# Patient Record
Sex: Female | Born: 1945 | Race: White | Hispanic: No | Marital: Married | Smoking: Never smoker
Health system: Southern US, Community
[De-identification: ages and names within clinical notes are randomized; demographics above are authoritative.]

## PROBLEM LIST (undated history)

## (undated) DIAGNOSIS — J45909 Unspecified asthma, uncomplicated: Secondary | ICD-10-CM

## (undated) DIAGNOSIS — E7800 Pure hypercholesterolemia, unspecified: Secondary | ICD-10-CM

## (undated) HISTORY — PX: BIOPSY CONJUNCTIVA: PRO10

## (undated) HISTORY — PX: BREAST EXCISIONAL BIOPSY: SUR124

---

## 2000-09-18 ENCOUNTER — Emergency Department (HOSPITAL_COMMUNITY): Admission: EM | Admit: 2000-09-18 | Discharge: 2000-09-18 | Payer: Self-pay | Admitting: Emergency Medicine

## 2001-05-11 ENCOUNTER — Other Ambulatory Visit: Admission: RE | Admit: 2001-05-11 | Discharge: 2001-05-11 | Payer: Self-pay | Admitting: Family Medicine

## 2001-12-24 ENCOUNTER — Emergency Department (HOSPITAL_COMMUNITY): Admission: EM | Admit: 2001-12-24 | Discharge: 2001-12-24 | Payer: Self-pay | Admitting: Emergency Medicine

## 2005-08-24 ENCOUNTER — Other Ambulatory Visit: Admission: RE | Admit: 2005-08-24 | Discharge: 2005-08-24 | Payer: Self-pay | Admitting: Obstetrics & Gynecology

## 2005-09-13 ENCOUNTER — Encounter: Admission: RE | Admit: 2005-09-13 | Discharge: 2005-09-13 | Payer: Self-pay | Admitting: Obstetrics & Gynecology

## 2005-09-21 ENCOUNTER — Encounter: Admission: RE | Admit: 2005-09-21 | Discharge: 2005-09-21 | Payer: Self-pay | Admitting: Obstetrics & Gynecology

## 2006-11-17 ENCOUNTER — Other Ambulatory Visit: Admission: RE | Admit: 2006-11-17 | Discharge: 2006-11-17 | Payer: Self-pay | Admitting: Family Medicine

## 2008-02-29 ENCOUNTER — Other Ambulatory Visit: Admission: RE | Admit: 2008-02-29 | Discharge: 2008-02-29 | Payer: Self-pay | Admitting: Internal Medicine

## 2008-03-04 ENCOUNTER — Encounter: Admission: RE | Admit: 2008-03-04 | Discharge: 2008-03-04 | Payer: Self-pay | Admitting: Internal Medicine

## 2008-03-11 ENCOUNTER — Encounter: Admission: RE | Admit: 2008-03-11 | Discharge: 2008-03-11 | Payer: Self-pay | Admitting: Internal Medicine

## 2009-04-18 ENCOUNTER — Encounter: Admission: RE | Admit: 2009-04-18 | Discharge: 2009-04-18 | Payer: Self-pay | Admitting: Internal Medicine

## 2010-04-03 ENCOUNTER — Other Ambulatory Visit: Payer: Self-pay | Admitting: Internal Medicine

## 2010-04-03 DIAGNOSIS — Z1231 Encounter for screening mammogram for malignant neoplasm of breast: Secondary | ICD-10-CM

## 2010-05-05 ENCOUNTER — Ambulatory Visit
Admission: RE | Admit: 2010-05-05 | Discharge: 2010-05-05 | Disposition: A | Discharge status: Home / Self Care | Payer: BLUE CROSS/BLUE SHIELD | Source: Ambulatory Visit | Attending: Internal Medicine | Admitting: Internal Medicine

## 2010-05-05 DIAGNOSIS — Z1231 Encounter for screening mammogram for malignant neoplasm of breast: Secondary | ICD-10-CM

## 2011-04-01 ENCOUNTER — Other Ambulatory Visit: Payer: Self-pay | Admitting: Internal Medicine

## 2011-04-01 DIAGNOSIS — Z1231 Encounter for screening mammogram for malignant neoplasm of breast: Secondary | ICD-10-CM

## 2011-05-06 ENCOUNTER — Ambulatory Visit
Admission: RE | Admit: 2011-05-06 | Discharge: 2011-05-06 | Disposition: A | Discharge status: Home / Self Care | Payer: BC Managed Care – PPO | Source: Ambulatory Visit | Attending: Internal Medicine | Admitting: Internal Medicine

## 2011-05-06 DIAGNOSIS — Z1231 Encounter for screening mammogram for malignant neoplasm of breast: Secondary | ICD-10-CM

## 2012-05-29 ENCOUNTER — Other Ambulatory Visit: Payer: Self-pay

## 2012-05-29 DIAGNOSIS — Z1231 Encounter for screening mammogram for malignant neoplasm of breast: Secondary | ICD-10-CM

## 2012-06-21 ENCOUNTER — Ambulatory Visit
Admission: RE | Admit: 2012-06-21 | Discharge: 2012-06-21 | Disposition: A | Discharge status: Home / Self Care | Payer: BC Managed Care – PPO | Source: Ambulatory Visit

## 2012-06-21 DIAGNOSIS — Z1231 Encounter for screening mammogram for malignant neoplasm of breast: Secondary | ICD-10-CM

## 2012-12-22 DIAGNOSIS — H1044 Vernal conjunctivitis: Secondary | ICD-10-CM | POA: Diagnosis not present

## 2013-04-10 DIAGNOSIS — M899 Disorder of bone, unspecified: Secondary | ICD-10-CM | POA: Diagnosis not present

## 2013-04-10 DIAGNOSIS — E785 Hyperlipidemia, unspecified: Secondary | ICD-10-CM | POA: Diagnosis not present

## 2013-04-17 DIAGNOSIS — M899 Disorder of bone, unspecified: Secondary | ICD-10-CM | POA: Diagnosis not present

## 2013-04-17 DIAGNOSIS — E785 Hyperlipidemia, unspecified: Secondary | ICD-10-CM | POA: Diagnosis not present

## 2013-04-17 DIAGNOSIS — F411 Generalized anxiety disorder: Secondary | ICD-10-CM | POA: Diagnosis not present

## 2013-04-17 DIAGNOSIS — Z23 Encounter for immunization: Secondary | ICD-10-CM | POA: Diagnosis not present

## 2013-04-17 DIAGNOSIS — M1710 Unilateral primary osteoarthritis, unspecified knee: Secondary | ICD-10-CM | POA: Diagnosis not present

## 2013-04-17 DIAGNOSIS — M949 Disorder of cartilage, unspecified: Secondary | ICD-10-CM | POA: Diagnosis not present

## 2013-04-17 DIAGNOSIS — IMO0002 Reserved for concepts with insufficient information to code with codable children: Secondary | ICD-10-CM | POA: Diagnosis not present

## 2013-06-14 ENCOUNTER — Other Ambulatory Visit: Payer: Self-pay

## 2013-06-14 DIAGNOSIS — Z1231 Encounter for screening mammogram for malignant neoplasm of breast: Secondary | ICD-10-CM

## 2013-07-02 ENCOUNTER — Ambulatory Visit
Admission: RE | Admit: 2013-07-02 | Discharge: 2013-07-02 | Disposition: A | Discharge status: Home / Self Care | Payer: Medicare Other | Source: Ambulatory Visit

## 2013-07-02 DIAGNOSIS — Z1231 Encounter for screening mammogram for malignant neoplasm of breast: Secondary | ICD-10-CM | POA: Diagnosis not present

## 2013-10-10 DIAGNOSIS — E785 Hyperlipidemia, unspecified: Secondary | ICD-10-CM | POA: Diagnosis not present

## 2013-10-10 DIAGNOSIS — M949 Disorder of cartilage, unspecified: Secondary | ICD-10-CM | POA: Diagnosis not present

## 2013-10-10 DIAGNOSIS — R609 Edema, unspecified: Secondary | ICD-10-CM | POA: Diagnosis not present

## 2013-10-10 DIAGNOSIS — M899 Disorder of bone, unspecified: Secondary | ICD-10-CM | POA: Diagnosis not present

## 2013-10-17 DIAGNOSIS — Z23 Encounter for immunization: Secondary | ICD-10-CM | POA: Diagnosis not present

## 2013-10-17 DIAGNOSIS — M899 Disorder of bone, unspecified: Secondary | ICD-10-CM | POA: Diagnosis not present

## 2013-10-17 DIAGNOSIS — M949 Disorder of cartilage, unspecified: Secondary | ICD-10-CM | POA: Diagnosis not present

## 2013-10-17 DIAGNOSIS — F411 Generalized anxiety disorder: Secondary | ICD-10-CM | POA: Diagnosis not present

## 2013-10-17 DIAGNOSIS — E785 Hyperlipidemia, unspecified: Secondary | ICD-10-CM | POA: Diagnosis not present

## 2013-10-17 DIAGNOSIS — M1710 Unilateral primary osteoarthritis, unspecified knee: Secondary | ICD-10-CM | POA: Diagnosis not present

## 2013-10-17 DIAGNOSIS — IMO0002 Reserved for concepts with insufficient information to code with codable children: Secondary | ICD-10-CM | POA: Diagnosis not present

## 2013-12-18 DIAGNOSIS — H26493 Other secondary cataract, bilateral: Secondary | ICD-10-CM | POA: Diagnosis not present

## 2014-04-16 DIAGNOSIS — Z0000 Encounter for general adult medical examination without abnormal findings: Secondary | ICD-10-CM | POA: Diagnosis not present

## 2014-04-16 DIAGNOSIS — Z23 Encounter for immunization: Secondary | ICD-10-CM | POA: Diagnosis not present

## 2014-04-16 DIAGNOSIS — M859 Disorder of bone density and structure, unspecified: Secondary | ICD-10-CM | POA: Diagnosis not present

## 2014-04-16 DIAGNOSIS — E785 Hyperlipidemia, unspecified: Secondary | ICD-10-CM | POA: Diagnosis not present

## 2014-04-16 DIAGNOSIS — E663 Overweight: Secondary | ICD-10-CM | POA: Diagnosis not present

## 2014-04-24 DIAGNOSIS — M8580 Other specified disorders of bone density and structure, unspecified site: Secondary | ICD-10-CM | POA: Diagnosis not present

## 2014-04-24 DIAGNOSIS — F419 Anxiety disorder, unspecified: Secondary | ICD-10-CM | POA: Diagnosis not present

## 2014-04-24 DIAGNOSIS — E2839 Other primary ovarian failure: Secondary | ICD-10-CM | POA: Diagnosis not present

## 2014-04-24 DIAGNOSIS — E785 Hyperlipidemia, unspecified: Secondary | ICD-10-CM | POA: Diagnosis not present

## 2014-04-24 DIAGNOSIS — I5032 Chronic diastolic (congestive) heart failure: Secondary | ICD-10-CM | POA: Diagnosis not present

## 2014-05-19 DIAGNOSIS — Z1212 Encounter for screening for malignant neoplasm of rectum: Secondary | ICD-10-CM | POA: Diagnosis not present

## 2014-05-19 DIAGNOSIS — Z1211 Encounter for screening for malignant neoplasm of colon: Secondary | ICD-10-CM | POA: Diagnosis not present

## 2014-07-11 ENCOUNTER — Other Ambulatory Visit: Payer: Self-pay

## 2014-07-11 DIAGNOSIS — Z1231 Encounter for screening mammogram for malignant neoplasm of breast: Secondary | ICD-10-CM

## 2014-07-16 ENCOUNTER — Ambulatory Visit
Admission: RE | Admit: 2014-07-16 | Discharge: 2014-07-16 | Disposition: A | Discharge status: Home / Self Care | Payer: Medicare Other | Source: Ambulatory Visit

## 2014-07-16 DIAGNOSIS — Z1231 Encounter for screening mammogram for malignant neoplasm of breast: Secondary | ICD-10-CM | POA: Diagnosis not present

## 2014-08-07 DIAGNOSIS — Z09 Encounter for follow-up examination after completed treatment for conditions other than malignant neoplasm: Secondary | ICD-10-CM | POA: Diagnosis not present

## 2014-08-07 DIAGNOSIS — D126 Benign neoplasm of colon, unspecified: Secondary | ICD-10-CM | POA: Diagnosis not present

## 2014-08-07 DIAGNOSIS — Z86010 Personal history of colonic polyps: Secondary | ICD-10-CM | POA: Diagnosis not present

## 2014-08-07 DIAGNOSIS — D122 Benign neoplasm of ascending colon: Secondary | ICD-10-CM | POA: Diagnosis not present

## 2014-08-07 DIAGNOSIS — K640 First degree hemorrhoids: Secondary | ICD-10-CM | POA: Diagnosis not present

## 2014-08-07 DIAGNOSIS — D123 Benign neoplasm of transverse colon: Secondary | ICD-10-CM | POA: Diagnosis not present

## 2014-10-16 DIAGNOSIS — I5032 Chronic diastolic (congestive) heart failure: Secondary | ICD-10-CM | POA: Diagnosis not present

## 2014-10-16 DIAGNOSIS — M8580 Other specified disorders of bone density and structure, unspecified site: Secondary | ICD-10-CM | POA: Diagnosis not present

## 2014-10-16 DIAGNOSIS — E785 Hyperlipidemia, unspecified: Secondary | ICD-10-CM | POA: Diagnosis not present

## 2014-10-16 DIAGNOSIS — E559 Vitamin D deficiency, unspecified: Secondary | ICD-10-CM | POA: Diagnosis not present

## 2014-11-06 DIAGNOSIS — Z23 Encounter for immunization: Secondary | ICD-10-CM | POA: Diagnosis not present

## 2014-12-24 DIAGNOSIS — H26493 Other secondary cataract, bilateral: Secondary | ICD-10-CM | POA: Diagnosis not present

## 2015-03-13 DIAGNOSIS — R300 Dysuria: Secondary | ICD-10-CM | POA: Diagnosis not present

## 2015-03-31 DIAGNOSIS — N390 Urinary tract infection, site not specified: Secondary | ICD-10-CM | POA: Diagnosis not present

## 2015-04-22 DIAGNOSIS — E663 Overweight: Secondary | ICD-10-CM | POA: Diagnosis not present

## 2015-04-22 DIAGNOSIS — E559 Vitamin D deficiency, unspecified: Secondary | ICD-10-CM | POA: Diagnosis not present

## 2015-04-22 DIAGNOSIS — E785 Hyperlipidemia, unspecified: Secondary | ICD-10-CM | POA: Diagnosis not present

## 2015-04-22 DIAGNOSIS — Z0000 Encounter for general adult medical examination without abnormal findings: Secondary | ICD-10-CM | POA: Diagnosis not present

## 2015-04-22 DIAGNOSIS — Z1389 Encounter for screening for other disorder: Secondary | ICD-10-CM | POA: Diagnosis not present

## 2015-04-22 DIAGNOSIS — M8580 Other specified disorders of bone density and structure, unspecified site: Secondary | ICD-10-CM | POA: Diagnosis not present

## 2015-04-29 DIAGNOSIS — M8580 Other specified disorders of bone density and structure, unspecified site: Secondary | ICD-10-CM | POA: Diagnosis not present

## 2015-04-29 DIAGNOSIS — E785 Hyperlipidemia, unspecified: Secondary | ICD-10-CM | POA: Diagnosis not present

## 2015-04-29 DIAGNOSIS — N182 Chronic kidney disease, stage 2 (mild): Secondary | ICD-10-CM | POA: Diagnosis not present

## 2015-04-29 DIAGNOSIS — F339 Major depressive disorder, recurrent, unspecified: Secondary | ICD-10-CM | POA: Diagnosis not present

## 2015-04-29 DIAGNOSIS — D709 Neutropenia, unspecified: Secondary | ICD-10-CM | POA: Diagnosis not present

## 2015-09-12 ENCOUNTER — Other Ambulatory Visit: Payer: Self-pay | Admitting: Internal Medicine

## 2015-09-12 DIAGNOSIS — Z1231 Encounter for screening mammogram for malignant neoplasm of breast: Secondary | ICD-10-CM

## 2015-09-17 ENCOUNTER — Ambulatory Visit
Admission: RE | Admit: 2015-09-17 | Discharge: 2015-09-17 | Disposition: A | Discharge status: Home / Self Care | Payer: Medicare Other | Source: Ambulatory Visit | Attending: Internal Medicine | Admitting: Internal Medicine

## 2015-09-17 DIAGNOSIS — Z1231 Encounter for screening mammogram for malignant neoplasm of breast: Secondary | ICD-10-CM | POA: Diagnosis not present

## 2015-09-18 ENCOUNTER — Other Ambulatory Visit: Payer: Self-pay

## 2015-09-19 ENCOUNTER — Other Ambulatory Visit: Payer: Self-pay | Admitting: Internal Medicine

## 2015-09-19 DIAGNOSIS — R928 Other abnormal and inconclusive findings on diagnostic imaging of breast: Secondary | ICD-10-CM

## 2015-09-26 ENCOUNTER — Ambulatory Visit
Admission: RE | Admit: 2015-09-26 | Discharge: 2015-09-26 | Disposition: A | Discharge status: Home / Self Care | Payer: Medicare Other | Source: Ambulatory Visit | Attending: Internal Medicine | Admitting: Internal Medicine

## 2015-09-26 ENCOUNTER — Other Ambulatory Visit: Payer: Self-pay | Admitting: Internal Medicine

## 2015-09-26 DIAGNOSIS — R928 Other abnormal and inconclusive findings on diagnostic imaging of breast: Secondary | ICD-10-CM

## 2015-09-26 DIAGNOSIS — N63 Unspecified lump in breast: Secondary | ICD-10-CM | POA: Diagnosis not present

## 2015-09-29 ENCOUNTER — Ambulatory Visit
Admission: RE | Admit: 2015-09-29 | Discharge: 2015-09-29 | Disposition: A | Discharge status: Home / Self Care | Payer: Medicare Other | Source: Ambulatory Visit | Attending: Internal Medicine | Admitting: Internal Medicine

## 2015-09-29 DIAGNOSIS — R928 Other abnormal and inconclusive findings on diagnostic imaging of breast: Secondary | ICD-10-CM

## 2015-09-29 DIAGNOSIS — N6002 Solitary cyst of left breast: Secondary | ICD-10-CM | POA: Diagnosis not present

## 2015-11-05 DIAGNOSIS — E785 Hyperlipidemia, unspecified: Secondary | ICD-10-CM | POA: Diagnosis not present

## 2015-11-05 DIAGNOSIS — E559 Vitamin D deficiency, unspecified: Secondary | ICD-10-CM | POA: Diagnosis not present

## 2015-11-05 DIAGNOSIS — M8580 Other specified disorders of bone density and structure, unspecified site: Secondary | ICD-10-CM | POA: Diagnosis not present

## 2015-11-12 DIAGNOSIS — Z23 Encounter for immunization: Secondary | ICD-10-CM | POA: Diagnosis not present

## 2015-11-12 DIAGNOSIS — R05 Cough: Secondary | ICD-10-CM | POA: Diagnosis not present

## 2015-11-12 DIAGNOSIS — E785 Hyperlipidemia, unspecified: Secondary | ICD-10-CM | POA: Diagnosis not present

## 2015-11-12 DIAGNOSIS — Z8709 Personal history of other diseases of the respiratory system: Secondary | ICD-10-CM | POA: Diagnosis not present

## 2015-11-12 DIAGNOSIS — F419 Anxiety disorder, unspecified: Secondary | ICD-10-CM | POA: Diagnosis not present

## 2015-11-19 DIAGNOSIS — J45909 Unspecified asthma, uncomplicated: Secondary | ICD-10-CM | POA: Diagnosis not present

## 2015-12-10 DIAGNOSIS — J189 Pneumonia, unspecified organism: Secondary | ICD-10-CM | POA: Diagnosis not present

## 2015-12-10 DIAGNOSIS — J4520 Mild intermittent asthma, uncomplicated: Secondary | ICD-10-CM | POA: Diagnosis not present

## 2015-12-23 DIAGNOSIS — H26491 Other secondary cataract, right eye: Secondary | ICD-10-CM | POA: Diagnosis not present

## 2016-01-22 DIAGNOSIS — R05 Cough: Secondary | ICD-10-CM | POA: Diagnosis not present

## 2016-04-22 DIAGNOSIS — E785 Hyperlipidemia, unspecified: Secondary | ICD-10-CM | POA: Diagnosis not present

## 2016-04-22 DIAGNOSIS — E559 Vitamin D deficiency, unspecified: Secondary | ICD-10-CM | POA: Diagnosis not present

## 2016-04-22 DIAGNOSIS — I5032 Chronic diastolic (congestive) heart failure: Secondary | ICD-10-CM | POA: Diagnosis not present

## 2016-04-29 DIAGNOSIS — E7800 Pure hypercholesterolemia, unspecified: Secondary | ICD-10-CM | POA: Diagnosis not present

## 2016-04-29 DIAGNOSIS — E875 Hyperkalemia: Secondary | ICD-10-CM | POA: Diagnosis not present

## 2016-04-29 DIAGNOSIS — Z0000 Encounter for general adult medical examination without abnormal findings: Secondary | ICD-10-CM | POA: Diagnosis not present

## 2016-04-29 DIAGNOSIS — M8589 Other specified disorders of bone density and structure, multiple sites: Secondary | ICD-10-CM | POA: Diagnosis not present

## 2016-04-29 DIAGNOSIS — Z01419 Encounter for gynecological examination (general) (routine) without abnormal findings: Secondary | ICD-10-CM | POA: Diagnosis not present

## 2016-05-03 DIAGNOSIS — E875 Hyperkalemia: Secondary | ICD-10-CM | POA: Diagnosis not present

## 2016-05-03 DIAGNOSIS — M859 Disorder of bone density and structure, unspecified: Secondary | ICD-10-CM | POA: Diagnosis not present

## 2016-05-03 DIAGNOSIS — M8580 Other specified disorders of bone density and structure, unspecified site: Secondary | ICD-10-CM | POA: Diagnosis not present

## 2016-10-04 ENCOUNTER — Other Ambulatory Visit: Payer: Self-pay | Admitting: Internal Medicine

## 2016-10-04 DIAGNOSIS — Z1231 Encounter for screening mammogram for malignant neoplasm of breast: Secondary | ICD-10-CM

## 2016-10-13 ENCOUNTER — Ambulatory Visit
Admission: RE | Admit: 2016-10-13 | Discharge: 2016-10-13 | Disposition: A | Discharge status: Home / Self Care | Payer: Medicare Other | Source: Ambulatory Visit | Attending: Internal Medicine | Admitting: Internal Medicine

## 2016-10-13 DIAGNOSIS — Z1231 Encounter for screening mammogram for malignant neoplasm of breast: Secondary | ICD-10-CM

## 2016-10-25 DIAGNOSIS — Z79899 Other long term (current) drug therapy: Secondary | ICD-10-CM | POA: Diagnosis not present

## 2016-11-01 DIAGNOSIS — Z23 Encounter for immunization: Secondary | ICD-10-CM | POA: Diagnosis not present

## 2016-11-01 DIAGNOSIS — E7800 Pure hypercholesterolemia, unspecified: Secondary | ICD-10-CM | POA: Diagnosis not present

## 2016-11-01 DIAGNOSIS — F419 Anxiety disorder, unspecified: Secondary | ICD-10-CM | POA: Diagnosis not present

## 2016-12-16 DIAGNOSIS — E785 Hyperlipidemia, unspecified: Secondary | ICD-10-CM | POA: Diagnosis not present

## 2016-12-16 DIAGNOSIS — F339 Major depressive disorder, recurrent, unspecified: Secondary | ICD-10-CM | POA: Diagnosis not present

## 2016-12-16 DIAGNOSIS — M170 Bilateral primary osteoarthritis of knee: Secondary | ICD-10-CM | POA: Diagnosis not present

## 2016-12-16 DIAGNOSIS — N182 Chronic kidney disease, stage 2 (mild): Secondary | ICD-10-CM | POA: Diagnosis not present

## 2016-12-29 DIAGNOSIS — H26491 Other secondary cataract, right eye: Secondary | ICD-10-CM | POA: Diagnosis not present

## 2017-01-13 DIAGNOSIS — M170 Bilateral primary osteoarthritis of knee: Secondary | ICD-10-CM | POA: Diagnosis not present

## 2017-01-13 DIAGNOSIS — E785 Hyperlipidemia, unspecified: Secondary | ICD-10-CM | POA: Diagnosis not present

## 2017-01-13 DIAGNOSIS — F339 Major depressive disorder, recurrent, unspecified: Secondary | ICD-10-CM | POA: Diagnosis not present

## 2017-01-13 DIAGNOSIS — N182 Chronic kidney disease, stage 2 (mild): Secondary | ICD-10-CM | POA: Diagnosis not present

## 2017-01-28 DIAGNOSIS — E785 Hyperlipidemia, unspecified: Secondary | ICD-10-CM | POA: Diagnosis not present

## 2017-01-28 DIAGNOSIS — N182 Chronic kidney disease, stage 2 (mild): Secondary | ICD-10-CM | POA: Diagnosis not present

## 2017-01-28 DIAGNOSIS — M170 Bilateral primary osteoarthritis of knee: Secondary | ICD-10-CM | POA: Diagnosis not present

## 2017-01-28 DIAGNOSIS — F339 Major depressive disorder, recurrent, unspecified: Secondary | ICD-10-CM | POA: Diagnosis not present

## 2017-02-14 DIAGNOSIS — F419 Anxiety disorder, unspecified: Secondary | ICD-10-CM | POA: Diagnosis not present

## 2017-02-14 DIAGNOSIS — J4520 Mild intermittent asthma, uncomplicated: Secondary | ICD-10-CM | POA: Diagnosis not present

## 2017-04-26 DIAGNOSIS — L309 Dermatitis, unspecified: Secondary | ICD-10-CM | POA: Diagnosis not present

## 2017-04-26 DIAGNOSIS — L304 Erythema intertrigo: Secondary | ICD-10-CM | POA: Diagnosis not present

## 2017-04-26 DIAGNOSIS — L719 Rosacea, unspecified: Secondary | ICD-10-CM | POA: Diagnosis not present

## 2017-05-02 DIAGNOSIS — E785 Hyperlipidemia, unspecified: Secondary | ICD-10-CM | POA: Diagnosis not present

## 2017-05-02 DIAGNOSIS — I5032 Chronic diastolic (congestive) heart failure: Secondary | ICD-10-CM | POA: Diagnosis not present

## 2017-05-02 DIAGNOSIS — Z79899 Other long term (current) drug therapy: Secondary | ICD-10-CM | POA: Diagnosis not present

## 2017-05-02 DIAGNOSIS — E559 Vitamin D deficiency, unspecified: Secondary | ICD-10-CM | POA: Diagnosis not present

## 2017-05-04 DIAGNOSIS — E7800 Pure hypercholesterolemia, unspecified: Secondary | ICD-10-CM | POA: Diagnosis not present

## 2017-05-04 DIAGNOSIS — Z1212 Encounter for screening for malignant neoplasm of rectum: Secondary | ICD-10-CM | POA: Diagnosis not present

## 2017-05-04 DIAGNOSIS — Z01419 Encounter for gynecological examination (general) (routine) without abnormal findings: Secondary | ICD-10-CM | POA: Diagnosis not present

## 2017-05-04 DIAGNOSIS — Z0000 Encounter for general adult medical examination without abnormal findings: Secondary | ICD-10-CM | POA: Diagnosis not present

## 2017-05-16 DIAGNOSIS — N390 Urinary tract infection, site not specified: Secondary | ICD-10-CM | POA: Diagnosis not present

## 2017-06-07 DIAGNOSIS — L309 Dermatitis, unspecified: Secondary | ICD-10-CM | POA: Diagnosis not present

## 2017-07-19 DIAGNOSIS — L309 Dermatitis, unspecified: Secondary | ICD-10-CM | POA: Diagnosis not present

## 2017-10-13 DIAGNOSIS — F411 Generalized anxiety disorder: Secondary | ICD-10-CM | POA: Diagnosis not present

## 2017-10-13 DIAGNOSIS — Z23 Encounter for immunization: Secondary | ICD-10-CM | POA: Diagnosis not present

## 2017-11-03 DIAGNOSIS — E7800 Pure hypercholesterolemia, unspecified: Secondary | ICD-10-CM | POA: Diagnosis not present

## 2017-11-14 ENCOUNTER — Other Ambulatory Visit: Payer: Self-pay | Admitting: Registered Nurse

## 2017-11-14 DIAGNOSIS — Z1231 Encounter for screening mammogram for malignant neoplasm of breast: Secondary | ICD-10-CM

## 2017-11-15 ENCOUNTER — Ambulatory Visit
Admission: RE | Admit: 2017-11-15 | Discharge: 2017-11-15 | Disposition: A | Discharge status: Home / Self Care | Payer: Medicare Other | Source: Ambulatory Visit | Attending: Registered Nurse | Admitting: Registered Nurse

## 2017-11-15 DIAGNOSIS — Z1231 Encounter for screening mammogram for malignant neoplasm of breast: Secondary | ICD-10-CM

## 2017-12-26 DIAGNOSIS — H1013 Acute atopic conjunctivitis, bilateral: Secondary | ICD-10-CM | POA: Diagnosis not present

## 2018-05-01 DIAGNOSIS — E7800 Pure hypercholesterolemia, unspecified: Secondary | ICD-10-CM | POA: Diagnosis not present

## 2018-05-08 DIAGNOSIS — Z01419 Encounter for gynecological examination (general) (routine) without abnormal findings: Secondary | ICD-10-CM | POA: Diagnosis not present

## 2018-05-08 DIAGNOSIS — E7800 Pure hypercholesterolemia, unspecified: Secondary | ICD-10-CM | POA: Diagnosis not present

## 2018-05-08 DIAGNOSIS — Z0000 Encounter for general adult medical examination without abnormal findings: Secondary | ICD-10-CM | POA: Diagnosis not present

## 2018-05-08 DIAGNOSIS — Z1212 Encounter for screening for malignant neoplasm of rectum: Secondary | ICD-10-CM | POA: Diagnosis not present

## 2018-06-14 ENCOUNTER — Other Ambulatory Visit: Payer: Self-pay

## 2018-06-14 ENCOUNTER — Emergency Department (HOSPITAL_COMMUNITY): Payer: Medicare Other

## 2018-06-14 ENCOUNTER — Encounter (HOSPITAL_COMMUNITY): Payer: Self-pay | Admitting: Emergency Medicine

## 2018-06-14 ENCOUNTER — Emergency Department (HOSPITAL_COMMUNITY)
Admission: EM | Admit: 2018-06-14 | Discharge: 2018-06-14 | Disposition: A | Discharge status: Home / Self Care | Payer: Medicare Other | Attending: Emergency Medicine | Admitting: Emergency Medicine

## 2018-06-14 DIAGNOSIS — Z8709 Personal history of other diseases of the respiratory system: Secondary | ICD-10-CM | POA: Insufficient documentation

## 2018-06-14 DIAGNOSIS — R42 Dizziness and giddiness: Secondary | ICD-10-CM | POA: Diagnosis present

## 2018-06-14 DIAGNOSIS — I471 Supraventricular tachycardia: Secondary | ICD-10-CM | POA: Diagnosis not present

## 2018-06-14 DIAGNOSIS — R531 Weakness: Secondary | ICD-10-CM | POA: Diagnosis not present

## 2018-06-14 DIAGNOSIS — R0602 Shortness of breath: Secondary | ICD-10-CM | POA: Insufficient documentation

## 2018-06-14 DIAGNOSIS — J9811 Atelectasis: Secondary | ICD-10-CM | POA: Diagnosis not present

## 2018-06-14 HISTORY — DX: Pure hypercholesterolemia, unspecified: E78.00

## 2018-06-14 HISTORY — DX: Unspecified asthma, uncomplicated: J45.909

## 2018-06-14 LAB — MAGNESIUM: Magnesium: 2 mg/dL (ref 1.7–2.4)

## 2018-06-14 LAB — TROPONIN I: Troponin I: <0.03 ng/mL (ref <0.03)

## 2018-06-14 LAB — BASIC METABOLIC PANEL
Anion gap: 15 (ref 5–15)
BUN: 15 mg/dL (ref 8–23)
CO2: 22 mmol/L (ref 22–32)
Calcium: 10.1 mg/dL (ref 8.9–10.3)
Chloride: 101 mmol/L (ref 98–111)
Creatinine, Ser: 1.08 mg/dL — ABNORMAL HIGH (ref 0.44–1.00)
GFR calc Af Amer: 60 mL/min — ABNORMAL LOW (ref >60)
GFR calc non Af Amer: 52 mL/min — ABNORMAL LOW (ref >60)
Glucose, Bld: 161 mg/dL — ABNORMAL HIGH (ref 70–99)
Potassium: 4.5 mmol/L (ref 3.5–5.1)
Sodium: 138 mmol/L (ref 135–145)

## 2018-06-14 LAB — CBC
HCT: 51.7 % — ABNORMAL HIGH (ref 36.0–46.0)
Hemoglobin: 16.9 g/dL — ABNORMAL HIGH (ref 12.0–15.0)
MCH: 28.9 pg (ref 26.0–34.0)
MCHC: 32.7 g/dL (ref 30.0–36.0)
MCV: 88.5 fL (ref 80.0–100.0)
Platelets: 332 10*3/uL (ref 150–400)
RBC: 5.84 MIL/uL — ABNORMAL HIGH (ref 3.87–5.11)
RDW: 14.1 % (ref 11.5–15.5)
WBC: 7.4 10*3/uL (ref 4.0–10.5)
nRBC: 0 % (ref 0.0–0.2)

## 2018-06-14 LAB — TSH: TSH: 1.569 u[IU]/mL (ref 0.350–4.500)

## 2018-06-14 LAB — T4, FREE: Free T4: 0.95 ng/dL (ref 0.82–1.77)

## 2018-06-14 MED ORDER — SODIUM CHLORIDE 0.9 % IV BOLUS
1000.0000 mL | Freq: Once | INTRAVENOUS | Status: AC
  Administered 2018-06-14: 1000 mL via INTRAVENOUS

## 2018-06-14 MED ORDER — ADENOSINE 6 MG/2ML IV SOLN
INTRAVENOUS | Status: AC
  Administered 2018-06-14: 11:00:00 6 mg
  Filled 2018-06-14: qty 6

## 2018-06-14 MED ORDER — SODIUM CHLORIDE 0.9% FLUSH: 3.0000 mL | Freq: Once | INTRAVENOUS | Status: DC

## 2018-06-14 NOTE — ED Triage Notes (Addendum)
Patient reports feeling short of breath and lightheaded onset just after 10am today while taking out empty boxes at work. Patient in SVT on arrival - denies history of same. She endorses L sided back pain and numbness in L side radiating up to L jaw. Denies cough, fevers/chills. Resp e/u, skin w/d.

## 2018-06-14 NOTE — ED Notes (Signed)
MD at bedside, attempting vagal maneuvers at this time. Patient A&O x 4, resp e/u at rest, talking in clear, full sentences.

## 2018-06-14 NOTE — Discharge Instructions (Addendum)
For now:  - Drink at least 8 glasses of water daily - Avoid caffeine, nicotine, any stimulants - Avoid Sudafed - Try to get enough sleep - No heavy lifting or heavy exercise until seen by Cardiology

## 2018-06-14 NOTE — ED Notes (Signed)
Patient reports feeling much better. HR now 90s, sinus rhythm. Patient denies pain, states lightheadedness and shortness of breath have improved and she now just feels weak.

## 2018-06-14 NOTE — ED Provider Notes (Signed)
[facility] EMERGENCY DEPARTMENT Provider Note   CSN: [id number] Arrival date & time: 06/14/18  1109    History   Chief Complaint Chief Complaint  Patient presents with  . Dizziness  . Shortness of Breath    HPI Amanda Reid is a 73 y.o. female.     HPI   73 yo F with PMHx asthma, HLD here with SOB, dizziness. Pt was in usual state of health this AM. She went to work (works at [employer]) and was taking a box out to a car when she experienced acute onset of sensation of lightheadedness and that she couldn't catch her breath. She denies any CP or palpitations. She felt lightheaded and called for help, and her husband came to pick her up and take her ot the Er. She remains SOB at rest. Denies any recent illnesses or medication changes. Does not drink caffeine. No weight loss or enegy supplements. No h/o cardiac arrhythmia. No leg swelling or h/o CHF or DVT/PE.  Past Medical History:  Diagnosis Date  . Asthma   . Hypercholesterolemia     There are no active problems to display for this patient.   Past Surgical History:  Procedure Laterality Date  . BREAST EXCISIONAL BIOPSY Bilateral    benign     OB History   No obstetric history on file.      Home Medications    Prior to Admission medications   Not on File    Family History No family history on file.  Social History Social History   Tobacco Use  . Smoking status: Not on file  Substance Use Topics  . Alcohol use: Not on file  . Drug use: Not on file     Allergies   Patient has no known allergies.   Review of Systems Review of Systems  Constitutional: Positive for fatigue. Negative for chills and fever.  HENT: Negative for congestion and rhinorrhea.   Eyes: Negative for visual disturbance.  Respiratory: Positive for shortness of breath. Negative for cough and wheezing.   Cardiovascular: Negative for chest pain and leg swelling.  Gastrointestinal: Negative for abdominal  pain, diarrhea, nausea and vomiting.  Genitourinary: Negative for dysuria and flank pain.  Musculoskeletal: Negative for neck pain and neck stiffness.  Skin: Negative for rash and wound.  Allergic/Immunologic: Negative for immunocompromised state.  Neurological: Positive for weakness and light-headedness. Negative for syncope and headaches.  All other systems reviewed and are negative.    Physical Exam Updated Vital Signs BP 132/82   Pulse 71   Temp 98.3 F (36.8 C)   Resp 16   SpO2 98%   Physical Exam Vitals signs and nursing note reviewed.  Constitutional:      General: She is not in acute distress.    Appearance: She is well-developed.  HENT:     Head: Normocephalic and atraumatic.     Mouth/Throat:     Mouth: Mucous membranes are dry.  Eyes:     Conjunctiva/sclera: Conjunctivae normal.  Neck:     Musculoskeletal: Neck supple.  Cardiovascular:     Rate and Rhythm: Regular rhythm. Tachycardia present.     Heart sounds: Normal heart sounds. No murmur. No friction rub.  Pulmonary:     Effort: Pulmonary effort is normal. No respiratory distress.     Breath sounds: Normal breath sounds. No wheezing or rales.  Abdominal:     General: There is no distension.     Palpations: Abdomen is soft.  Tenderness: There is no abdominal tenderness.  Skin:    General: Skin is warm.     Capillary Refill: Capillary refill takes less than 2 seconds.  Neurological:     Mental Status: She is alert and oriented to person, place, and time.     Motor: No abnormal muscle tone.      ED Treatments / Results  Labs (all labs ordered are listed, but only abnormal results are displayed) Labs Reviewed  BASIC METABOLIC PANEL - Abnormal; Notable for the following components:      Result Value   Glucose, Bld 161 (*)    Creatinine, Ser 1.08 (*)    GFR calc non Af Amer 52 (*)    GFR calc Af Amer 60 (*)    All other components within normal limits  CBC - Abnormal; Notable for the following  components:   RBC 5.84 (*)    Hemoglobin 16.9 (*)    HCT 51.7 (*)    All other components within normal limits  TROPONIN I  MAGNESIUM  TSH  T4, FREE  URINALYSIS, ROUTINE W REFLEX MICROSCOPIC    EKG EKG Interpretation  Date/Time:  Wednesday Jun 14 2018 11:30:42 EDT Ventricular Rate:  114 PR Interval:    QRS Duration: 93 QT Interval:  315 QTC Calculation: 434 R Axis:   65 Text Interpretation:  Sinus tachycardia Minimal ST depression, diffuse leads Since last EKG, SVT has resolved Confirmed by Duffy Bruce [phone]) on 06/14/2018 11:47:30 AM   Radiology Dg Chest Portable 1 View  Result Date: 06/14/2018 CLINICAL DATA:  Supraventricular tachycardia. EXAM: PORTABLE CHEST 1 VIEW COMPARISON:  01/22/2016 FINDINGS: External pacer paddles are noted. The heart is normal in size. Normal aortic contour. Slightly low lung volumes with streaky basilar atelectasis but no infiltrates, edema or effusions. No worrisome pulmonary lesions. The bony thorax is intact. IMPRESSION: No acute cardiopulmonary findings. Minimal streaky basilar atelectasis. Electronically Signed   By: Marijo Sanes M.D.   On: 06/14/2018 11:43    Procedures .Critical Care Performed by: Duffy Bruce, MD Authorized by: Duffy Bruce, MD   Critical care provider statement:    Critical care time (minutes):  35   Critical care time was exclusive of:  Separately billable procedures and treating other patients and teaching time   Critical care was necessary to treat or prevent imminent or life-threatening deterioration of the following conditions:  Circulatory failure and cardiac failure   Critical care was time spent personally by me on the following activities:  Development of treatment plan with patient or surrogate, discussions with consultants, evaluation of patient's response to treatment, examination of patient, obtaining history from patient or surrogate, ordering and performing treatments and interventions, ordering  and review of laboratory studies, ordering and review of radiographic studies, pulse oximetry, re-evaluation of patient's condition and review of old charts   I assumed direction of critical care for this patient from another provider in my specialty: no     (including critical care time)  Medications Ordered in ED Medications  sodium chloride flush (NS) 0.9 % injection 3 mL (has no administration in time range)  adenosine (ADENOCARD) 6 MG/2ML injection (6 mg  Given 06/14/18 1129)  sodium chloride 0.9 % bolus 1,000 mL (0 mLs Intravenous Stopped 06/14/18 1244)  sodium chloride 0.9 % bolus 1,000 mL (0 mLs Intravenous Stopped 06/14/18 1440)     Initial Impression / Assessment and Plan / ED Course  I have reviewed the triage vital signs and the nursing notes.  Pertinent  labs & imaging results that were available during my care of the patient were reviewed by me and considered in my medical decision making (see chart for details).  Clinical Course as of Jun 13 1729  Wed Jun 14, 2018  1400 73 yo F here with transient SVT. I suspect this was provoked in setting of dehydration, exercise, stress related to work. No h/o same. Vagal attempted, pt cardioverted w/ 1 dose of Adenosine. Sx now resolved. Labs show mild hemoconcentration, otherwise unremarkable. Lytes wnl. Mag wnl. Trop neg. Will check thyroid studies, plan to d/c if neg.   [CI]    Clinical Course User Index [CI] Duffy Bruce, MD       Thyroid panel neg. Pt remains in NSR with no recurrence of SVT and she is now back to baseline. Denies CP, SOB. No signs of DVT/PE clinically. Will refer for cards f/u. Pt reportedly has h/o hypotension w/ orthostasis so will hold on initiating any beta blocker at this time, as risks outweigh benefits with single, resolved episode of SVT.  Final Clinical Impressions(s) / ED Diagnoses   Final diagnoses:  SVT (supraventricular tachycardia) [facility])    ED Discharge Orders    None       Duffy Bruce, MD 06/14/18 1731

## 2018-06-14 NOTE — ED Notes (Signed)
MD at bedside. 

## 2018-06-14 NOTE — ED Notes (Signed)
Patients husband picked patient up when discharged.

## 2018-07-05 ENCOUNTER — Telehealth: Payer: Self-pay

## 2018-07-05 ENCOUNTER — Encounter: Payer: Self-pay | Admitting: Cardiovascular Disease

## 2018-07-05 ENCOUNTER — Other Ambulatory Visit: Payer: Self-pay

## 2018-07-05 ENCOUNTER — Ambulatory Visit (INDEPENDENT_AMBULATORY_CARE_PROVIDER_SITE_OTHER): Payer: Medicare Other | Admitting: Cardiovascular Disease

## 2018-07-05 DIAGNOSIS — E782 Mixed hyperlipidemia: Secondary | ICD-10-CM | POA: Diagnosis not present

## 2018-07-05 DIAGNOSIS — E785 Hyperlipidemia, unspecified: Secondary | ICD-10-CM | POA: Insufficient documentation

## 2018-07-05 DIAGNOSIS — I471 Supraventricular tachycardia, unspecified: Secondary | ICD-10-CM | POA: Insufficient documentation

## 2018-07-05 NOTE — Assessment & Plan Note (Signed)
Recent episode of PSVT 06/14/2018 at a rate of 200 with prep with IV adenosine.  Patient denies some back neck and jaw discomfort during the episode.  This is her first and only episode.  There have been no recurrent symptoms.  She does have a strong family history for heart disease.  Thyroid function tests are normal.  Going to order a 2D echo and exercise Myoview stress test rule out an ischemic etiology.

## 2018-07-05 NOTE — Assessment & Plan Note (Signed)
History of hyperlipidemia on Lipitor with lipid profile performed 03/17/2018 revealing cholesterol 184, LDL 110 and HDL 52

## 2018-07-05 NOTE — Addendum Note (Signed)
Addended by: Annita Brod on: 07/05/2018 02:35 PM   Modules accepted: Orders

## 2018-07-05 NOTE — Progress Notes (Signed)
07/05/2018 Amanda Reid   Jan 22, 1945  [id number]  Primary Physician Holland Commons, FNP Primary Cardiologist: Lorretta Harp MD Lupe Carney, [place]  HPI:  Amanda Reid is a 73 y.o. mildly overweight married Caucasian female mother of 2 living children (1 deceased because of cancer), grandmother to 8 grandchildren who currently works in a company that [employer]	[place] [employer] whilst in [place] for the last 18 years.  She was referred by the emergency room for further evaluation of a recent episode of PSVT which occurred on 06/14/2018.  Her risk factors for ischemic heart disease include family history with both parents and had CABG.  She has hyperlipidemia on Lipitor.  She is never had a heart attack or stroke.  She had the onset of weakness, and lightheadedness with shortness of breath along with slight pain in her back and jaw on 06/14/2018.  She went to the emergency room where her heart rate was noted to be 200.  This did not break with Valsalva maneuver but ultimately did with administration of IV adenosine.  Her labs are otherwise unremarkable.  She had no recurrent episodes.   Current Meds  Medication Sig  . albuterol (VENTOLIN HFA) 108 (90 Base) MCG/ACT inhaler Inhale 2 puffs into the lungs every 6 (six) hours as needed for wheezing or shortness of breath.  Marland Kitchen atorvastatin (LIPITOR) 40 MG tablet Take 40 mg by mouth daily.  . Calcium-Magnesium (CAL/MAG CITRATE PO) Take 1 tablet by mouth daily.  . Magnesium Citrate 125 MG CAPS Take 125 mg by mouth daily.  . nortriptyline (PAMELOR) 25 MG capsule Take 25 mg by mouth daily.     Allergies  Allergen Reactions  . Codeine Itching and Rash  . Sulfa Antibiotics Nausea And Vomiting    Social History   Socioeconomic History  . Marital status: Married    Spouse name: Not on file  . Number of children: Not on file  . Years of education: Not on file  . Highest education level: Not on file  Occupational  History  . Not on file  Social Needs  . Financial resource strain: Not on file  . Food insecurity    Worry: Not on file    Inability: Not on file  . Transportation needs    Medical: Not on file    Non-medical: Not on file  Tobacco Use  . Smoking status: Not on file  Substance and Sexual Activity  . Alcohol use: Not on file  . Drug use: Not on file  . Sexual activity: Not on file  Lifestyle  . Physical activity    Days per week: Not on file    Minutes per session: Not on file  . Stress: Not on file  Relationships  . Social Herbalist on phone: Not on file    Gets together: Not on file    Attends religious service: Not on file    Active member of club or organization: Not on file    Attends meetings of clubs or organizations: Not on file    Relationship status: Not on file  . Intimate partner violence    Fear of current or ex partner: Not on file    Emotionally abused: Not on file    Physically abused: Not on file    Forced sexual activity: Not on file  Other Topics Concern  . Not on file  Social History Narrative  . Not on file  Review of Systems: General: negative for chills, fever, night sweats or weight changes.  Cardiovascular: negative for chest pain, dyspnea on exertion, edema, orthopnea, palpitations, paroxysmal nocturnal dyspnea or shortness of breath Dermatological: negative for rash Respiratory: negative for cough or wheezing Urologic: negative for hematuria Abdominal: negative for nausea, vomiting, diarrhea, bright red blood per rectum, melena, or hematemesis Neurologic: negative for visual changes, syncope, or dizziness All other systems reviewed and are otherwise negative except as noted above.    Blood pressure 132/90, pulse 76, temperature 98.3 F (36.8 C), height 5' (1.524 m), weight 154 lb (69.9 kg).  General appearance: alert and no distress Neck: no adenopathy, no carotid bruit, no JVD, supple, symmetrical, trachea midline and  thyroid not enlarged, symmetric, no tenderness/mass/nodules Lungs: clear to auscultation bilaterally Heart: regular rate and rhythm, S1, S2 normal, no murmur, click, rub or gallop Extremities: extremities normal, atraumatic, no cyanosis or edema Pulses: 2+ and symmetric Skin: Skin color, texture, turgor normal. No rashes or lesions Neurologic: Alert and oriented X 3, normal strength and tone. Normal symmetric reflexes. Normal coordination and gait  EKG not performed today  ASSESSMENT AND PLAN:   Hyperlipidemia History of hyperlipidemia on Lipitor with lipid profile performed 03/17/2018 revealing cholesterol 184, LDL 110 and HDL 52  Paroxysmal SVT (supraventricular tachycardia) (HCC) Recent episode of PSVT 06/14/2018 at a rate of 200 with prep with IV adenosine.  Patient denies some back neck and jaw discomfort during the episode.  This is her first and only episode.  There have been no recurrent symptoms.  She does have a strong family history for heart disease.  Thyroid function tests are normal.  Going to order a 2D echo and exercise Myoview stress test rule out an ischemic etiology.      Lorretta Harp MD FACP,FACC,FAHA, [facility] 07/05/2018 2:20 PM

## 2018-07-05 NOTE — Patient Instructions (Addendum)
Medication Instructions:  Your physician recommends that you continue on your current medications as directed. Please refer to the Current Medication list given to you today.  If you need a refill on your cardiac medications before your next appointment, please call your pharmacy.   Lab work: NONE If you have labs (blood work) drawn today and your tests are completely normal, you will receive your results only by: Marland Kitchen MyChart Message (if you have MyChart) OR . A paper copy in the mail If you have any lab test that is abnormal or we need to change your treatment, we will call you to review the results.  Testing/Procedures: Your physician has requested that you have an echocardiogram. Echocardiography is a painless test that uses sound waves to create images of your heart. It provides your doctor with information about the size and shape of your heart and how well your heart's chambers and valves are working. This procedure takes approximately one hour. There are no restrictions for this procedure. LOCATION; [place], [place], [place] [address]  Your physician has requested that you have a lexiscan myoview. For further information please visit [email or web address]. Please follow instruction sheet, as given.    Follow-Up: At [facility], you and your health needs are our priority.  As part of our continuing mission to provide you with exceptional heart care, we have created designated Provider Care Teams.  These Care Teams include your primary Cardiologist (physician) and Advanced Practice Providers (APPs -  Physician Assistants and Nurse Practitioners) who all work together to provide you with the care you need, when you need it. You will need a follow up appointment in 6-8 weeks WITH DR. Gwenlyn Found.  Please call our office 2 months in advance to schedule this appointment.

## 2018-07-05 NOTE — Telephone Encounter (Signed)
Pt already aware of updated test changed from exercise myoview to lexiscan myoview. Updated AVS mailed to address on file

## 2018-07-05 NOTE — Addendum Note (Signed)
Addended by: Annita Brod on: 07/05/2018 02:53 PM   Modules accepted: Orders

## 2018-07-06 ENCOUNTER — Telehealth (HOSPITAL_COMMUNITY): Payer: Self-pay | Admitting: *Deleted

## 2018-07-06 NOTE — Telephone Encounter (Signed)
Close encounter 

## 2018-07-07 ENCOUNTER — Ambulatory Visit (HOSPITAL_COMMUNITY)
Admission: RE | Admit: 2018-07-07 | Discharge: 2018-07-07 | Disposition: A | Discharge status: Home / Self Care | Payer: Medicare Other | Source: Ambulatory Visit | Attending: Cardiovascular Disease | Admitting: Cardiovascular Disease

## 2018-07-07 ENCOUNTER — Encounter (HOSPITAL_COMMUNITY): Payer: Medicare Other

## 2018-07-07 ENCOUNTER — Other Ambulatory Visit: Payer: Self-pay

## 2018-07-07 DIAGNOSIS — I471 Supraventricular tachycardia: Secondary | ICD-10-CM | POA: Diagnosis not present

## 2018-07-07 LAB — MYOCARDIAL PERFUSION IMAGING
LV dias vol: 52 mL (ref 46–106)
LV sys vol: 15 mL
Peak HR: 108 {beats}/min
Rest HR: 70 {beats}/min
SDS: 0
SRS: 0
SSS: 0
TID: 1.24

## 2018-07-07 MED ORDER — TECHNETIUM TC 99M TETROFOSMIN IV KIT
9.6000 | PACK | Freq: Once | INTRAVENOUS | Status: AC | PRN
  Administered 2018-07-07: 9.6 via INTRAVENOUS
  Filled 2018-07-07: qty 10

## 2018-07-07 MED ORDER — TECHNETIUM TC 99M TETROFOSMIN IV KIT
32.1000 | PACK | Freq: Once | INTRAVENOUS | Status: AC | PRN
  Administered 2018-07-07: 32.1 via INTRAVENOUS
  Filled 2018-07-07: qty 33

## 2018-07-07 MED ORDER — REGADENOSON 0.4 MG/5ML IV SOLN
0.4000 mg | Freq: Once | INTRAVENOUS | Status: AC
  Administered 2018-07-07: 0.4 mg via INTRAVENOUS

## 2018-07-11 ENCOUNTER — Telehealth (HOSPITAL_COMMUNITY): Payer: Self-pay | Admitting: Radiology

## 2018-07-11 NOTE — Telephone Encounter (Signed)

## 2018-07-12 ENCOUNTER — Other Ambulatory Visit: Payer: Self-pay

## 2018-07-12 ENCOUNTER — Ambulatory Visit (HOSPITAL_COMMUNITY): Payer: Medicare Other | Attending: Cardiology

## 2018-07-12 DIAGNOSIS — I471 Supraventricular tachycardia: Secondary | ICD-10-CM | POA: Insufficient documentation

## 2018-07-13 ENCOUNTER — Inpatient Hospital Stay (HOSPITAL_COMMUNITY): Admission: RE | Admit: 2018-07-13 | Payer: Medicare Other | Source: Ambulatory Visit

## 2018-08-07 ENCOUNTER — Telehealth: Payer: Self-pay

## 2018-08-07 NOTE — Telephone Encounter (Signed)
Spoke with pt who is ok with changing appt from 7/22 at 3:45pm to 4:30pm. Appt changed in Epic to 7/22 at 4:30pm. Pt verbalized understanding

## 2018-08-09 ENCOUNTER — Other Ambulatory Visit: Payer: Self-pay

## 2018-08-09 ENCOUNTER — Encounter: Payer: Self-pay | Admitting: Cardiovascular Disease

## 2018-08-09 ENCOUNTER — Ambulatory Visit (INDEPENDENT_AMBULATORY_CARE_PROVIDER_SITE_OTHER): Payer: Medicare Other | Admitting: Cardiovascular Disease

## 2018-08-09 VITALS — BP 120/72 | HR 81 | Temp 96.8°F | Ht 60.0 in | Wt 156.0 lb

## 2018-08-09 DIAGNOSIS — I471 Supraventricular tachycardia: Secondary | ICD-10-CM

## 2018-08-09 NOTE — Progress Notes (Signed)
Ms. Amanda Reid returns today for follow-up of her 2D echo and Myoview stress test all of which were normal.  These were done in the evaluation of some jaw and chest pain in the setting of PSVT at a rate of 200 which resolved after the administration of IV adenosine.  She has had no recurrence.  I will see her back in 1 year for follow-up.Lorretta Harp, M.D., [place], [facility], [place] [address] [place], [place]  [address]  [phone] 08/09/2018 5:04 PM

## 2018-08-09 NOTE — Patient Instructions (Signed)

## 2018-10-02 DIAGNOSIS — Z23 Encounter for immunization: Secondary | ICD-10-CM | POA: Diagnosis not present

## 2018-10-19 ENCOUNTER — Other Ambulatory Visit: Payer: Self-pay | Admitting: Registered Nurse

## 2018-10-19 DIAGNOSIS — Z1231 Encounter for screening mammogram for malignant neoplasm of breast: Secondary | ICD-10-CM

## 2018-11-20 ENCOUNTER — Ambulatory Visit
Admission: RE | Admit: 2018-11-20 | Discharge: 2018-11-20 | Disposition: A | Discharge status: Home / Self Care | Payer: Medicare Other | Source: Ambulatory Visit | Attending: Registered Nurse | Admitting: Registered Nurse

## 2018-11-20 ENCOUNTER — Other Ambulatory Visit: Payer: Self-pay

## 2018-11-20 DIAGNOSIS — Z1231 Encounter for screening mammogram for malignant neoplasm of breast: Secondary | ICD-10-CM | POA: Diagnosis not present

## 2019-02-14 ENCOUNTER — Ambulatory Visit: Payer: Medicare Other

## 2019-02-23 ENCOUNTER — Ambulatory Visit: Payer: Medicare Other

## 2019-02-25 ENCOUNTER — Ambulatory Visit: Payer: Medicare Other

## 2019-05-09 DIAGNOSIS — E559 Vitamin D deficiency, unspecified: Secondary | ICD-10-CM | POA: Diagnosis not present

## 2019-05-09 DIAGNOSIS — Z0000 Encounter for general adult medical examination without abnormal findings: Secondary | ICD-10-CM | POA: Diagnosis not present

## 2019-05-09 DIAGNOSIS — E785 Hyperlipidemia, unspecified: Secondary | ICD-10-CM | POA: Diagnosis not present

## 2019-05-09 DIAGNOSIS — M8580 Other specified disorders of bone density and structure, unspecified site: Secondary | ICD-10-CM | POA: Diagnosis not present

## 2019-05-14 DIAGNOSIS — Z0000 Encounter for general adult medical examination without abnormal findings: Secondary | ICD-10-CM | POA: Diagnosis not present

## 2019-05-14 DIAGNOSIS — Z1212 Encounter for screening for malignant neoplasm of rectum: Secondary | ICD-10-CM | POA: Diagnosis not present

## 2019-05-14 DIAGNOSIS — Z01419 Encounter for gynecological examination (general) (routine) without abnormal findings: Secondary | ICD-10-CM | POA: Diagnosis not present

## 2019-11-06 DIAGNOSIS — Z23 Encounter for immunization: Secondary | ICD-10-CM | POA: Diagnosis not present

## 2019-11-13 DIAGNOSIS — M19011 Primary osteoarthritis, right shoulder: Secondary | ICD-10-CM | POA: Diagnosis not present

## 2019-11-13 DIAGNOSIS — G8929 Other chronic pain: Secondary | ICD-10-CM | POA: Diagnosis not present

## 2019-11-13 DIAGNOSIS — M25511 Pain in right shoulder: Secondary | ICD-10-CM | POA: Diagnosis not present

## 2019-11-13 DIAGNOSIS — M25611 Stiffness of right shoulder, not elsewhere classified: Secondary | ICD-10-CM | POA: Diagnosis not present

## 2019-11-16 DIAGNOSIS — M25511 Pain in right shoulder: Secondary | ICD-10-CM | POA: Diagnosis not present

## 2019-11-19 ENCOUNTER — Other Ambulatory Visit: Payer: Self-pay | Admitting: Registered Nurse

## 2019-11-19 DIAGNOSIS — Z1231 Encounter for screening mammogram for malignant neoplasm of breast: Secondary | ICD-10-CM

## 2019-11-21 DIAGNOSIS — Z23 Encounter for immunization: Secondary | ICD-10-CM | POA: Diagnosis not present

## 2019-11-24 DIAGNOSIS — M25511 Pain in right shoulder: Secondary | ICD-10-CM | POA: Diagnosis not present

## 2019-11-28 DIAGNOSIS — M75121 Complete rotator cuff tear or rupture of right shoulder, not specified as traumatic: Secondary | ICD-10-CM | POA: Diagnosis not present

## 2019-11-30 DIAGNOSIS — M6281 Muscle weakness (generalized): Secondary | ICD-10-CM | POA: Diagnosis not present

## 2019-11-30 DIAGNOSIS — S46011D Strain of muscle(s) and tendon(s) of the rotator cuff of right shoulder, subsequent encounter: Secondary | ICD-10-CM | POA: Diagnosis not present

## 2020-01-01 ENCOUNTER — Ambulatory Visit: Payer: Medicare Other

## 2020-01-03 ENCOUNTER — Other Ambulatory Visit: Payer: Self-pay

## 2020-01-03 ENCOUNTER — Ambulatory Visit
Admission: RE | Admit: 2020-01-03 | Discharge: 2020-01-03 | Disposition: A | Discharge status: Home / Self Care | Payer: Medicare Other | Source: Ambulatory Visit | Attending: Registered Nurse | Admitting: Registered Nurse

## 2020-01-03 DIAGNOSIS — Z1231 Encounter for screening mammogram for malignant neoplasm of breast: Secondary | ICD-10-CM | POA: Diagnosis not present

## 2020-03-04 DIAGNOSIS — H18513 Endothelial corneal dystrophy, bilateral: Secondary | ICD-10-CM | POA: Diagnosis not present

## 2020-03-04 DIAGNOSIS — Z961 Presence of intraocular lens: Secondary | ICD-10-CM | POA: Diagnosis not present

## 2020-03-04 DIAGNOSIS — H524 Presbyopia: Secondary | ICD-10-CM | POA: Diagnosis not present

## 2020-03-04 DIAGNOSIS — H5202 Hypermetropia, left eye: Secondary | ICD-10-CM | POA: Diagnosis not present

## 2020-05-30 DIAGNOSIS — E785 Hyperlipidemia, unspecified: Secondary | ICD-10-CM | POA: Diagnosis not present

## 2020-05-30 DIAGNOSIS — M170 Bilateral primary osteoarthritis of knee: Secondary | ICD-10-CM | POA: Diagnosis not present

## 2020-05-30 DIAGNOSIS — E875 Hyperkalemia: Secondary | ICD-10-CM | POA: Diagnosis not present

## 2020-05-30 DIAGNOSIS — E663 Overweight: Secondary | ICD-10-CM | POA: Diagnosis not present

## 2020-05-30 DIAGNOSIS — E559 Vitamin D deficiency, unspecified: Secondary | ICD-10-CM | POA: Diagnosis not present

## 2020-05-30 DIAGNOSIS — Z0000 Encounter for general adult medical examination without abnormal findings: Secondary | ICD-10-CM | POA: Diagnosis not present

## 2020-06-01 IMAGING — MG DIGITAL SCREENING BILAT W/ TOMO W/ CAD
8 series · 8 of 24 positions shown · non-contrast
Comparison: Previous exam(s).

CLINICAL DATA: Screening.

EXAM:
DIGITAL SCREENING BILATERAL MAMMOGRAM WITH TOMO AND CAD

[R MLO synth-2D]
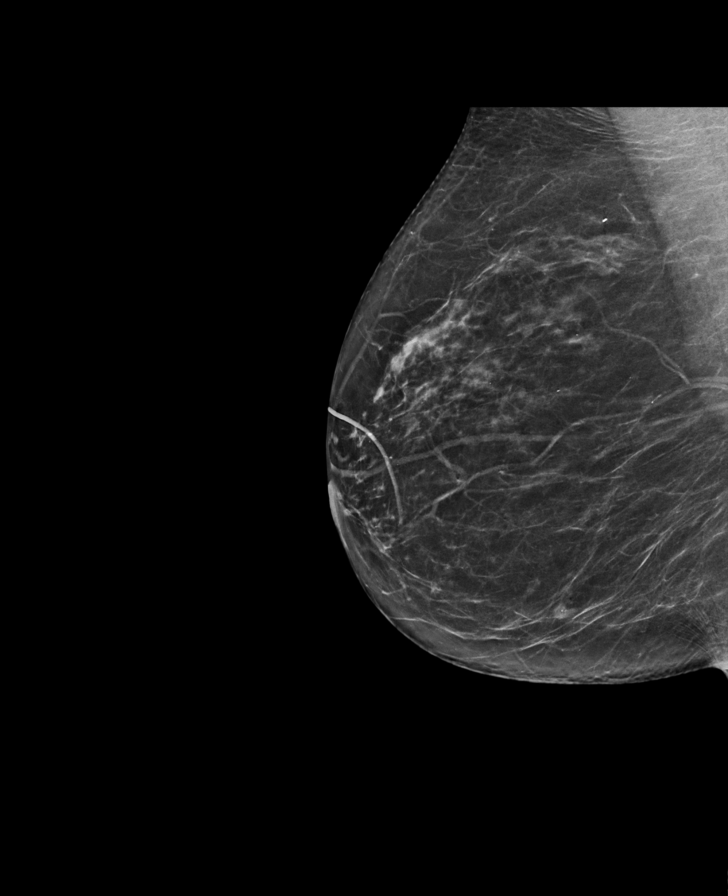

[L MLO synth-2D]
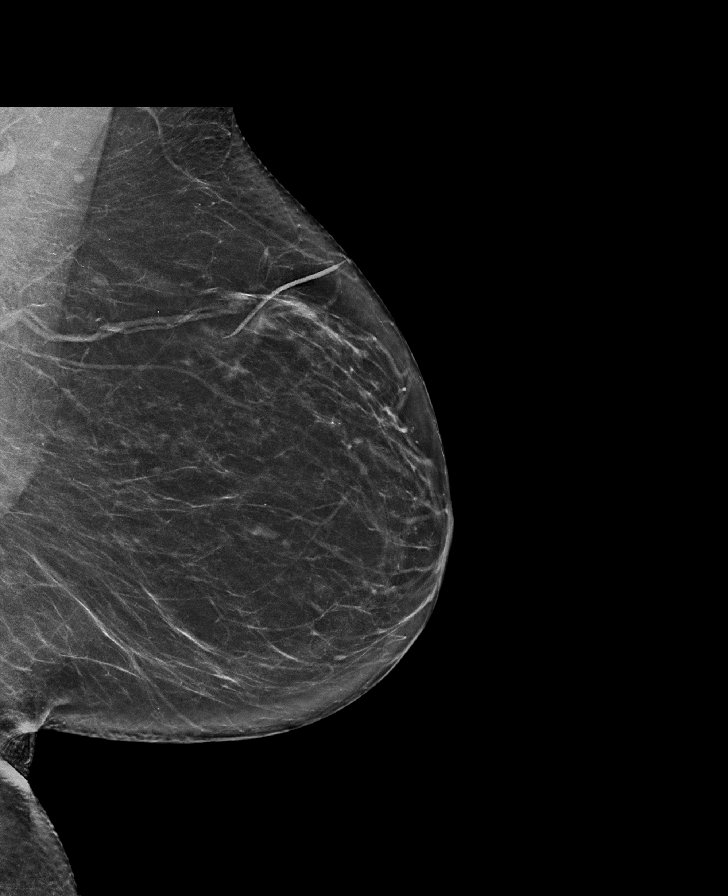

[L CC synth-2D]
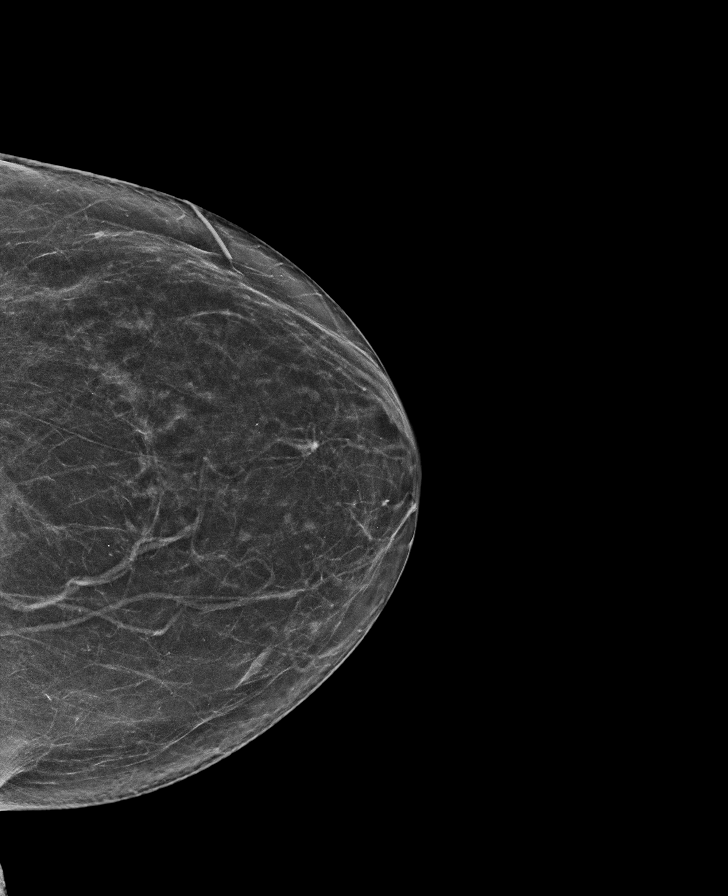

[R CC synth-2D]
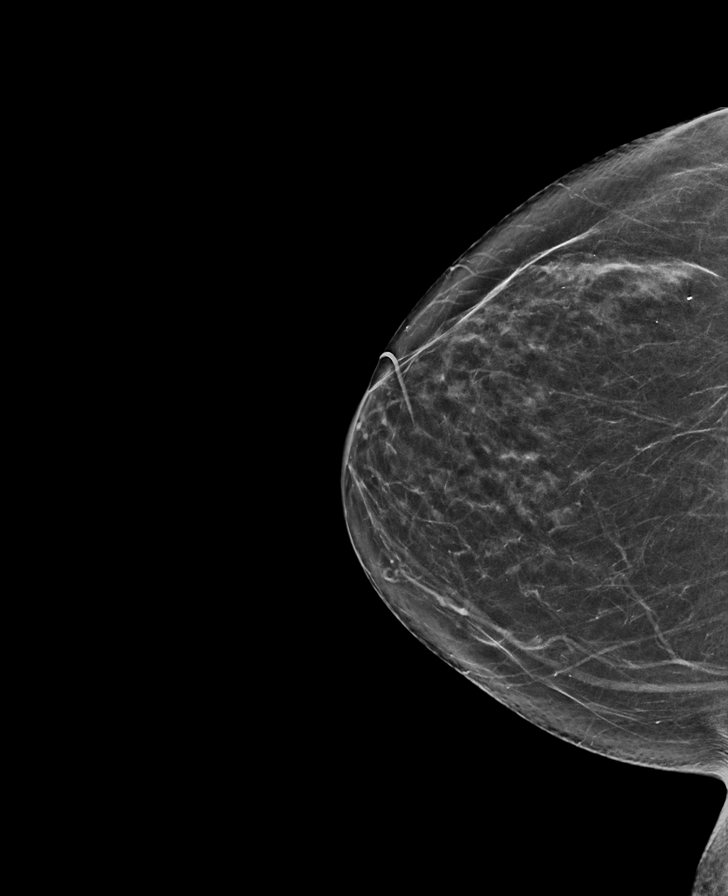

[L MLO tomo · tomo slice 36/71.0]
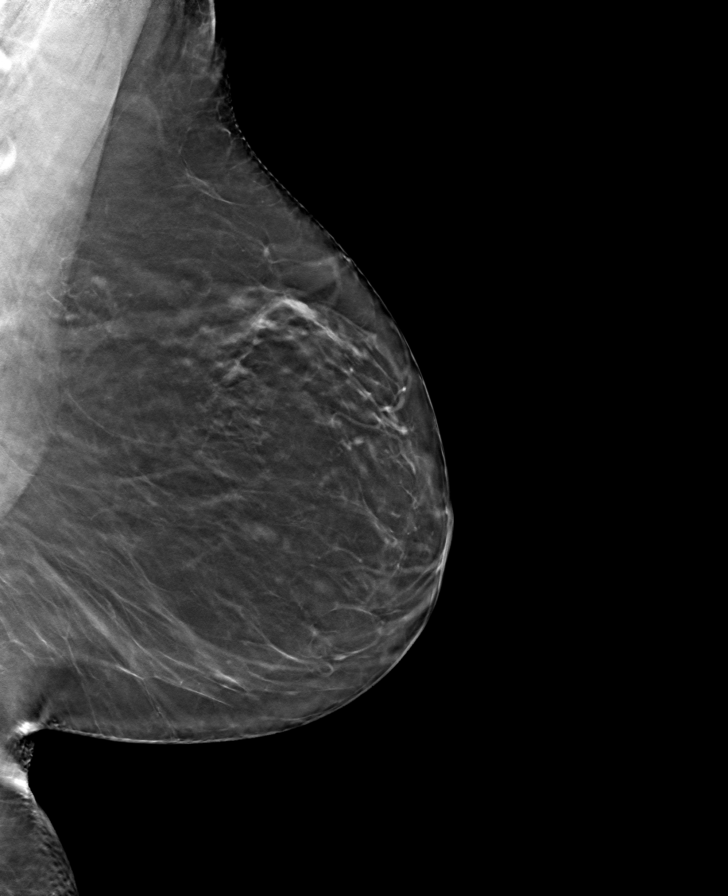

[L CC tomo · tomo slice 34/67.0]
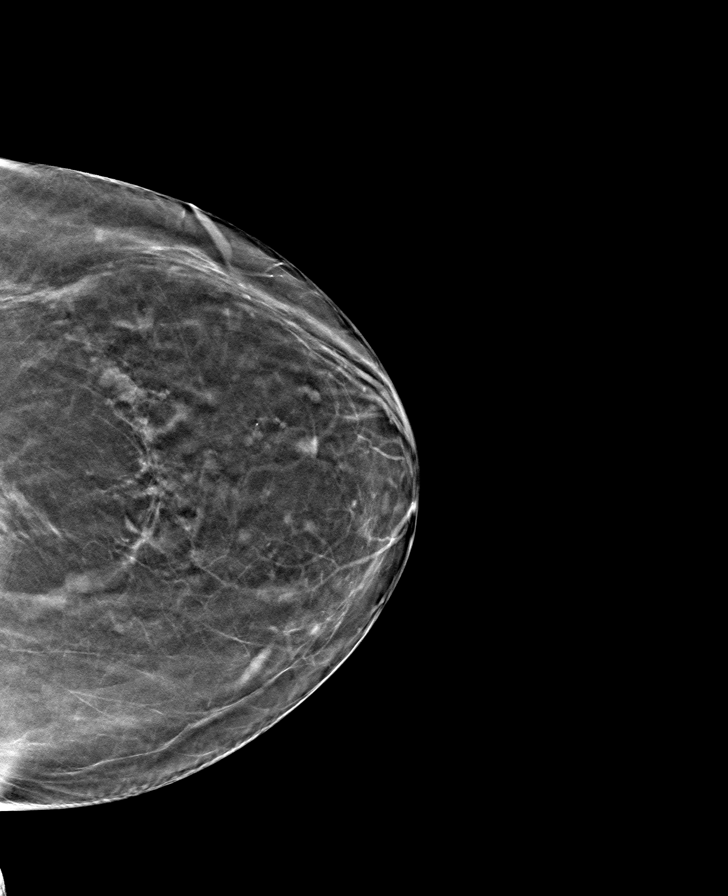

[R MLO tomo · tomo slice 34/67.0]
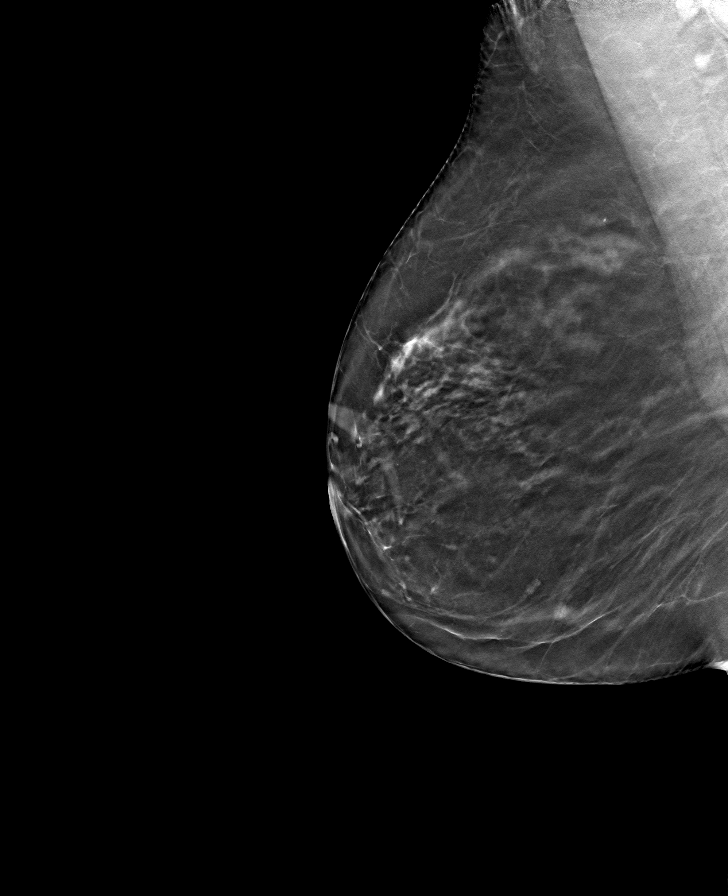

[R CC tomo · tomo slice 35/68.0]
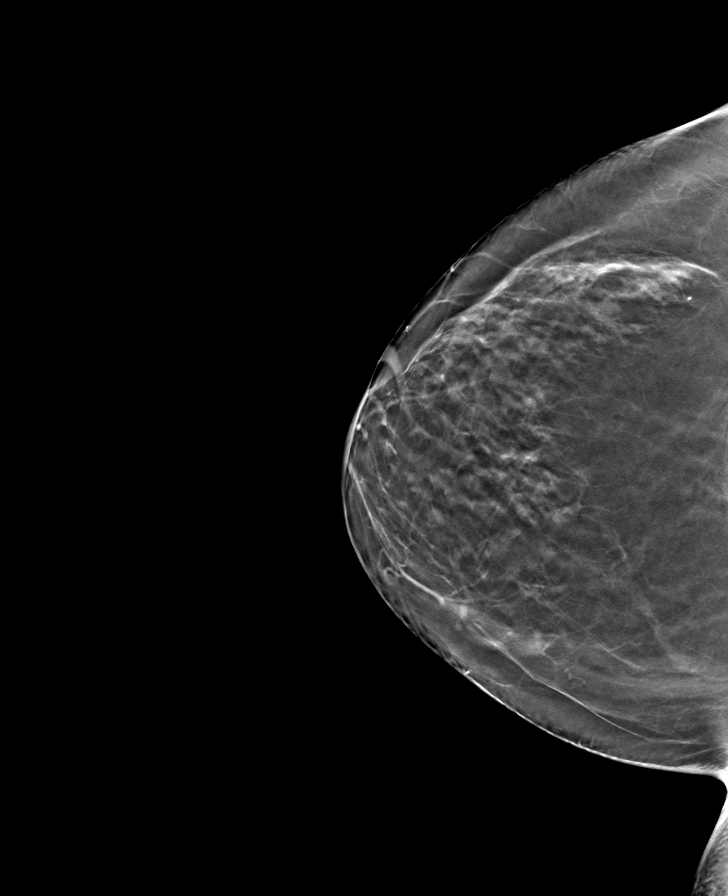

[8 of 24 positions shown; findings below may reference images not displayed]

ACR Breast Density Category b: There are scattered areas of
fibroglandular density.
FINDINGS: There are no findings suspicious for malignancy. Images were
processed with CAD.
IMPRESSION: No mammographic evidence of malignancy. A result letter of this
screening mammogram will be mailed directly to the patient.

RECOMMENDATION:
Screening mammogram in one year. (Code:[id number])

BI-RADS CATEGORY  1: Negative.

## 2020-06-05 DIAGNOSIS — J45909 Unspecified asthma, uncomplicated: Secondary | ICD-10-CM | POA: Diagnosis not present

## 2020-06-05 DIAGNOSIS — J302 Other seasonal allergic rhinitis: Secondary | ICD-10-CM | POA: Diagnosis not present

## 2020-06-05 DIAGNOSIS — F411 Generalized anxiety disorder: Secondary | ICD-10-CM | POA: Diagnosis not present

## 2020-06-05 DIAGNOSIS — F339 Major depressive disorder, recurrent, unspecified: Secondary | ICD-10-CM | POA: Diagnosis not present

## 2020-06-05 DIAGNOSIS — Z0000 Encounter for general adult medical examination without abnormal findings: Secondary | ICD-10-CM | POA: Diagnosis not present

## 2020-06-05 DIAGNOSIS — E785 Hyperlipidemia, unspecified: Secondary | ICD-10-CM | POA: Diagnosis not present

## 2020-07-07 DIAGNOSIS — F339 Major depressive disorder, recurrent, unspecified: Secondary | ICD-10-CM | POA: Diagnosis not present

## 2020-07-07 DIAGNOSIS — F419 Anxiety disorder, unspecified: Secondary | ICD-10-CM | POA: Diagnosis not present

## 2020-07-07 DIAGNOSIS — J302 Other seasonal allergic rhinitis: Secondary | ICD-10-CM | POA: Diagnosis not present

## 2020-07-07 DIAGNOSIS — E7800 Pure hypercholesterolemia, unspecified: Secondary | ICD-10-CM | POA: Diagnosis not present

## 2020-10-03 DIAGNOSIS — Z23 Encounter for immunization: Secondary | ICD-10-CM | POA: Diagnosis not present

## 2020-11-19 ENCOUNTER — Other Ambulatory Visit: Payer: Self-pay

## 2020-11-19 DIAGNOSIS — Z1231 Encounter for screening mammogram for malignant neoplasm of breast: Secondary | ICD-10-CM

## 2021-01-05 ENCOUNTER — Ambulatory Visit
Admission: RE | Admit: 2021-01-05 | Discharge: 2021-01-05 | Disposition: A | Discharge status: Home / Self Care | Payer: Medicare PPO | Source: Ambulatory Visit

## 2021-01-05 ENCOUNTER — Other Ambulatory Visit: Payer: Self-pay

## 2021-01-05 DIAGNOSIS — Z1231 Encounter for screening mammogram for malignant neoplasm of breast: Secondary | ICD-10-CM

## 2021-01-07 ENCOUNTER — Other Ambulatory Visit: Payer: Self-pay

## 2021-01-07 DIAGNOSIS — R928 Other abnormal and inconclusive findings on diagnostic imaging of breast: Secondary | ICD-10-CM

## 2021-02-03 ENCOUNTER — Ambulatory Visit
Admission: RE | Admit: 2021-02-03 | Discharge: 2021-02-03 | Disposition: A | Discharge status: Home / Self Care | Payer: Medicare PPO | Source: Ambulatory Visit

## 2021-02-03 ENCOUNTER — Other Ambulatory Visit: Payer: Self-pay

## 2021-02-03 DIAGNOSIS — R928 Other abnormal and inconclusive findings on diagnostic imaging of breast: Secondary | ICD-10-CM

## 2021-02-03 DIAGNOSIS — N6001 Solitary cyst of right breast: Secondary | ICD-10-CM

## 2021-02-03 DIAGNOSIS — N6311 Unspecified lump in the right breast, upper outer quadrant: Secondary | ICD-10-CM | POA: Diagnosis not present

## 2021-02-17 ENCOUNTER — Other Ambulatory Visit: Payer: Medicare PPO

## 2021-03-05 DIAGNOSIS — H04123 Dry eye syndrome of bilateral lacrimal glands: Secondary | ICD-10-CM | POA: Diagnosis not present

## 2021-03-05 DIAGNOSIS — H5202 Hypermetropia, left eye: Secondary | ICD-10-CM | POA: Diagnosis not present

## 2021-03-05 DIAGNOSIS — Z961 Presence of intraocular lens: Secondary | ICD-10-CM | POA: Diagnosis not present

## 2021-03-05 DIAGNOSIS — H18513 Endothelial corneal dystrophy, bilateral: Secondary | ICD-10-CM | POA: Diagnosis not present

## 2021-06-04 DIAGNOSIS — E7800 Pure hypercholesterolemia, unspecified: Secondary | ICD-10-CM | POA: Diagnosis not present

## 2021-06-04 DIAGNOSIS — E875 Hyperkalemia: Secondary | ICD-10-CM | POA: Diagnosis not present

## 2021-06-04 DIAGNOSIS — Z0000 Encounter for general adult medical examination without abnormal findings: Secondary | ICD-10-CM | POA: Diagnosis not present

## 2021-06-08 DIAGNOSIS — Z23 Encounter for immunization: Secondary | ICD-10-CM | POA: Diagnosis not present

## 2021-06-08 DIAGNOSIS — J302 Other seasonal allergic rhinitis: Secondary | ICD-10-CM | POA: Diagnosis not present

## 2021-06-08 DIAGNOSIS — Z0000 Encounter for general adult medical examination without abnormal findings: Secondary | ICD-10-CM | POA: Diagnosis not present

## 2021-06-08 DIAGNOSIS — J45909 Unspecified asthma, uncomplicated: Secondary | ICD-10-CM | POA: Diagnosis not present

## 2021-06-08 DIAGNOSIS — E785 Hyperlipidemia, unspecified: Secondary | ICD-10-CM | POA: Diagnosis not present

## 2021-06-08 DIAGNOSIS — F411 Generalized anxiety disorder: Secondary | ICD-10-CM | POA: Diagnosis not present

## 2021-06-08 DIAGNOSIS — E559 Vitamin D deficiency, unspecified: Secondary | ICD-10-CM | POA: Diagnosis not present

## 2021-06-08 DIAGNOSIS — R7303 Prediabetes: Secondary | ICD-10-CM | POA: Diagnosis not present

## 2021-06-08 DIAGNOSIS — N182 Chronic kidney disease, stage 2 (mild): Secondary | ICD-10-CM | POA: Diagnosis not present

## 2021-08-02 ENCOUNTER — Emergency Department (HOSPITAL_BASED_OUTPATIENT_CLINIC_OR_DEPARTMENT_OTHER)
Admission: EM | Admit: 2021-08-02 | Discharge: 2021-08-02 | Disposition: A | Discharge status: Home / Self Care | Payer: Medicare PPO | Attending: Emergency Medicine | Admitting: Emergency Medicine

## 2021-08-02 ENCOUNTER — Emergency Department (HOSPITAL_BASED_OUTPATIENT_CLINIC_OR_DEPARTMENT_OTHER): Payer: Medicare PPO

## 2021-08-02 ENCOUNTER — Encounter (HOSPITAL_BASED_OUTPATIENT_CLINIC_OR_DEPARTMENT_OTHER): Payer: Self-pay | Admitting: Emergency Medicine

## 2021-08-02 ENCOUNTER — Other Ambulatory Visit: Payer: Self-pay

## 2021-08-02 DIAGNOSIS — K7689 Other specified diseases of liver: Secondary | ICD-10-CM | POA: Diagnosis not present

## 2021-08-02 DIAGNOSIS — N189 Chronic kidney disease, unspecified: Secondary | ICD-10-CM | POA: Insufficient documentation

## 2021-08-02 DIAGNOSIS — J45909 Unspecified asthma, uncomplicated: Secondary | ICD-10-CM | POA: Insufficient documentation

## 2021-08-02 DIAGNOSIS — K5792 Diverticulitis of intestine, part unspecified, without perforation or abscess without bleeding: Secondary | ICD-10-CM | POA: Diagnosis not present

## 2021-08-02 DIAGNOSIS — K6389 Other specified diseases of intestine: Secondary | ICD-10-CM | POA: Diagnosis not present

## 2021-08-02 DIAGNOSIS — R1032 Left lower quadrant pain: Secondary | ICD-10-CM | POA: Diagnosis present

## 2021-08-02 LAB — COMPREHENSIVE METABOLIC PANEL
ALT: 17 U/L (ref 0–44)
AST: 30 U/L (ref 15–41)
Albumin: 4.4 g/dL (ref 3.5–5.0)
Alkaline Phosphatase: 66 U/L (ref 38–126)
Anion gap: 13 (ref 5–15)
BUN: 10 mg/dL (ref 8–23)
CO2: 24 mmol/L (ref 22–32)
Calcium: 10.1 mg/dL (ref 8.9–10.3)
Chloride: 102 mmol/L (ref 98–111)
Creatinine, Ser: 0.81 mg/dL (ref 0.44–1.00)
GFR, Estimated: >60 mL/min (ref >60)
Glucose, Bld: 112 mg/dL — ABNORMAL HIGH (ref 70–99)
Potassium: 4 mmol/L (ref 3.5–5.1)
Sodium: 139 mmol/L (ref 135–145)
Total Bilirubin: 1.4 mg/dL — ABNORMAL HIGH (ref 0.3–1.2)
Total Protein: 7.1 g/dL (ref 6.5–8.1)

## 2021-08-02 LAB — CBC
HCT: 45 % (ref 36.0–46.0)
Hemoglobin: 15.4 g/dL — ABNORMAL HIGH (ref 12.0–15.0)
MCH: 29.6 pg (ref 26.0–34.0)
MCHC: 34.2 g/dL (ref 30.0–36.0)
MCV: 86.4 fL (ref 80.0–100.0)
Platelets: 311 10*3/uL (ref 150–400)
RBC: 5.21 MIL/uL — ABNORMAL HIGH (ref 3.87–5.11)
RDW: 14.5 % (ref 11.5–15.5)
WBC: 11.7 10*3/uL — ABNORMAL HIGH (ref 4.0–10.5)
nRBC: 0 % (ref 0.0–0.2)

## 2021-08-02 LAB — URINALYSIS, ROUTINE W REFLEX MICROSCOPIC
Bilirubin Urine: NEGATIVE
Glucose, UA: NEGATIVE mg/dL
Ketones, ur: 40 mg/dL — AB
Leukocytes,Ua: NEGATIVE
Nitrite: NEGATIVE
Specific Gravity, Urine: 1.019 (ref 1.005–1.030)
pH: 6 (ref 5.0–8.0)

## 2021-08-02 LAB — LIPASE, BLOOD: Lipase: 39 U/L (ref 11–51)

## 2021-08-02 MED ORDER — CIPROFLOXACIN HCL 500 MG PO TABS: 500.0000 mg | ORAL_TABLET | Freq: Two times a day (BID) | ORAL | 0 refills | Status: AC

## 2021-08-02 MED ORDER — DICYCLOMINE HCL 20 MG PO TABS: 20.0000 mg | ORAL_TABLET | Freq: Two times a day (BID) | ORAL | 0 refills | Status: AC

## 2021-08-02 MED ORDER — ONDANSETRON HCL 4 MG/2ML IJ SOLN
4.0000 mg | Freq: Once | INTRAMUSCULAR | Status: AC
  Administered 2021-08-02: 4 mg via INTRAVENOUS
  Filled 2021-08-02: qty 2

## 2021-08-02 MED ORDER — LACTATED RINGERS IV BOLUS
500.0000 mL | Freq: Once | INTRAVENOUS | Status: AC
  Administered 2021-08-02: 500 mL via INTRAVENOUS

## 2021-08-02 MED ORDER — ONDANSETRON 4 MG PO TBDP: 4.0000 mg | ORAL_TABLET | Freq: Three times a day (TID) | ORAL | 0 refills | Status: AC | PRN

## 2021-08-02 MED ORDER — IOHEXOL 300 MG/ML  SOLN
100.0000 mL | Freq: Once | INTRAMUSCULAR | Status: AC | PRN
  Administered 2021-08-02: 100 mL via INTRAVENOUS

## 2021-08-02 MED ORDER — METRONIDAZOLE 500 MG PO TABS: 500.0000 mg | ORAL_TABLET | Freq: Three times a day (TID) | ORAL | 0 refills | Status: AC

## 2021-08-02 MED ORDER — FENTANYL CITRATE PF 50 MCG/ML IJ SOSY
50.0000 ug | PREFILLED_SYRINGE | Freq: Once | INTRAMUSCULAR | Status: AC
  Administered 2021-08-02: 50 ug via INTRAVENOUS
  Filled 2021-08-02: qty 1

## 2021-08-02 NOTE — ED Notes (Signed)
Patient transported to CT 

## 2021-08-02 NOTE — ED Provider Notes (Signed)
[facility] EMERGENCY DEPT Provider Note   CSN: [id number] Arrival date & time: 08/02/21  4696     History  Chief Complaint  Patient presents with   Abdominal Pain    Amanda Reid is a 76 y.o. female.  HPI     76yo female with history of asthma, hypercholesterolemia, CKD, anxiety, SVT, who presents with concern for abdominal pain.   Reports that she has had left lower quadrant pain for the last 2 weeks.  Reports has had episodes of mild left lower quadrant pain in the past which is resolved over the course of the week, but has had the symptoms for 2 weeks with worsening left lower quadrant abdominal pain.  Denies vomiting, fever.  Reports having small stool, some diarrhea, and some stool with mucus and bright red blood.  Does not associated nausea.  Feels whenever she drinks water, it makes her left lower quadrant pain worse.  Reports that she was told she had diverticula a long time ago but does not recall having diverticulitis.  Denies dysuria, chest pain, shortness of breath  Past Medical History:  Diagnosis Date   Asthma    Hypercholesterolemia     Home Medications Prior to Admission medications   Medication Sig Start Date End Date Taking? Authorizing Provider  ciprofloxacin (CIPRO) 500 MG tablet Take 1 tablet (500 mg total) by mouth every 12 (twelve) hours for 14 days. 08/02/21 08/16/21 Yes Alvira Monday, MD  dicyclomine (BENTYL) 20 MG tablet Take 1 tablet (20 mg total) by mouth 2 (two) times daily. 08/02/21  Yes Alvira Monday, MD  metroNIDAZOLE (FLAGYL) 500 MG tablet Take 1 tablet (500 mg total) by mouth 3 (three) times daily for 14 days. 08/02/21 08/16/21 Yes Alvira Monday, MD  ondansetron (ZOFRAN-ODT) 4 MG disintegrating tablet Take 1 tablet (4 mg total) by mouth every 8 (eight) hours as needed for nausea or vomiting. 08/02/21  Yes Alvira Monday, MD  albuterol (VENTOLIN HFA) 108 (90 Base) MCG/ACT inhaler Inhale 2 puffs into the lungs every 6  (six) hours as needed for wheezing or shortness of breath.    [provider]  atorvastatin (LIPITOR) 40 MG tablet Take 40 mg by mouth daily. 05/30/18   [provider]  Calcium-Magnesium (CAL/MAG CITRATE PO) Take 1 tablet by mouth daily. 07/01/09   [provider]  Magnesium Citrate 125 MG CAPS Take 125 mg by mouth daily.    [provider]  nortriptyline (PAMELOR) 25 MG capsule Take 25 mg by mouth daily. 04/25/18   [provider]      Allergies    Codeine and Sulfa antibiotics    Review of Systems   Review of Systems  Physical Exam Updated Vital Signs BP 135/76   Pulse 79   Temp 99 F (37.2 C) (Oral)   Resp 14   SpO2 93%  Physical Exam Vitals and nursing note reviewed.  Constitutional:      General: She is not in acute distress.    Appearance: She is well-developed. She is not diaphoretic.  HENT:     Head: Normocephalic and atraumatic.  Eyes:     Conjunctiva/sclera: Conjunctivae normal.  Cardiovascular:     Rate and Rhythm: Normal rate and regular rhythm.     Heart sounds: Normal heart sounds. No murmur heard.    No friction rub. No gallop.  Pulmonary:     Effort: Pulmonary effort is normal. No respiratory distress.     Breath sounds: Normal breath sounds. No wheezing or  rales.  Abdominal:     General: There is no distension.     Palpations: Abdomen is soft.     Tenderness: There is abdominal tenderness. There is guarding.  Musculoskeletal:        General: No tenderness.     Cervical back: Normal range of motion.  Skin:    General: Skin is warm and dry.     Findings: No erythema or rash.  Neurological:     Mental Status: She is alert and oriented to person, place, and time.     ED Results / Procedures / Treatments   Labs (all labs ordered are listed, but only abnormal results are displayed) Labs Reviewed  COMPREHENSIVE METABOLIC PANEL - Abnormal; Notable for the following components:      Result Value   Glucose, Bld  112 (*)    Total Bilirubin 1.4 (*)    All other components within normal limits  CBC - Abnormal; Notable for the following components:   WBC 11.7 (*)    RBC 5.21 (*)    Hemoglobin 15.4 (*)    All other components within normal limits  URINALYSIS, ROUTINE W REFLEX MICROSCOPIC - Abnormal; Notable for the following components:   Hgb urine dipstick MODERATE (*)    Ketones, ur 40 (*)    Protein, ur TRACE (*)    All other components within normal limits  LIPASE, BLOOD    EKG None  Radiology CT ABDOMEN PELVIS W CONTRAST  Result Date: 08/02/2021 CLINICAL DATA:  76 year old female with history of left lower quadrant abdominal pain for the past 2 weeks, worsened when eating or drinking. Mucus and blood in stool. EXAM: CT ABDOMEN AND PELVIS WITH CONTRAST TECHNIQUE: Multidetector CT imaging of the abdomen and pelvis was performed using the standard protocol following bolus administration of intravenous contrast. RADIATION DOSE REDUCTION: This exam was performed according to the departmental dose-optimization program which includes automated exposure control, adjustment of the mA and/or kV according to patient size and/or use of iterative reconstruction technique. CONTRAST:  100 mL of Omnipaque 300. COMPARISON:  None Available. FINDINGS: Lower chest: Areas of mild scarring and/or atelectasis are noted in the right middle lobe and inferior segment of the lingula. Hepatobiliary: A few subcentimeter low-attenuation lesions are scattered throughout the hepatic parenchyma, too small to definitively characterize, but statistically likely tiny cysts. No aggressive appearing hepatic lesions. No intra or extrahepatic biliary ductal dilatation. Gallbladder is unremarkable in appearance. Pancreas: No pancreatic mass. No pancreatic ductal dilatation. No pancreatic or peripancreatic fluid collections or inflammatory changes. Spleen: Unremarkable. Adrenals/Urinary Tract: Subcentimeter low-attenuation lesions in the left  kidney, too small to definitively characterize, but statistically likely to represent tiny cysts (no imaging follow-up is recommended). Right kidney and bilateral adrenal glands are normal in appearance. No hydroureteronephrosis. Urinary bladder is normal in appearance. Stomach/Bowel: The appearance of the stomach is normal. No pathologic dilatation of small bowel or colon. Numerous colonic diverticulae are noted, particularly in the sigmoid colon where there is also extensive mural thickening and surrounding inflammatory changes adjacent to an inflamed diverticulum, indicative of acute diverticulitis. No discrete diverticular abscess or definitive findings to suggest frank perforation of the bowel are confidently identified at this time. Normal appendix. Vascular/Lymphatic: Aortic atherosclerosis. No aneurysm or dissection noted in the abdominal or pelvic vasculature. No lymphadenopathy noted in the abdomen or pelvis. Reproductive: Enhancing lesion in the right-side of the uterine fundus measuring 2 cm, presumably a fibroid. Ovaries are unremarkable in appearance. Other: Trace volume of ascites, likely reactive.  No pneumoperitoneum. Musculoskeletal: There are no aggressive appearing lytic or blastic lesions noted in the visualized portions of the skeleton. IMPRESSION: 1. Acute diverticulitis of the sigmoid colon. No diverticular abscess or signs of frank perforation are noted at this time. 2. Aortic atherosclerosis. 3. Additional incidental findings, as above. Electronically Signed   By: Trudie Reed M.D.   On: 08/02/2021 10:10    Procedures Procedures    Medications Ordered in ED Medications  lactated ringers bolus 500 mL (500 mLs Intravenous New Bag/Given 08/02/21 0913)  ondansetron (ZOFRAN) injection 4 mg (4 mg Intravenous Given 08/02/21 0915)  fentaNYL (SUBLIMAZE) injection 50 mcg (50 mcg Intravenous Given 08/02/21 0919)  iohexol (OMNIPAQUE) 300 MG/ML solution 100 mL (100 mLs Intravenous Contrast  Given 08/02/21 1610)    ED Course/ Medical Decision Making/ A&P                           Medical Decision Making Amount and/or Complexity of Data Reviewed Labs: ordered. Radiology: ordered.  Risk Prescription drug management.   76yo female with history of asthma, hypercholesterolemia, CKD, anxiety, SVT, who presents with concern for abdominal pain.   DDx includes appendicitis, pancreatitis, cholecystitis, pyelonephritis, nephrolithiasis, diverticulitis, AAA, SBO.   Labs completed and personally evaluated by me show no evidence of significant electrolyte abnormalities, liver abnormalities, note mild leukocytosis, no anemia, no sign of urinary tract infection.  CT abdomen pelvis completed and personally abided by me and shows acute diverticulitis of the sigmoid colon without diverticular abscess or perforation, a 2 cm area on the fundus of the uterus presumably a fibroid.  She feels improved following pain, nausea medication and hydration in the emergency department.  Discussed results of CT.  Given she has had symptoms for about 2 weeks, feels appropriate to start antibiotics.  Given a prescription for Flagyl, Cipro, Bentyl for pain and Zofran. Patient discharged in stable condition with understanding of reasons to return.          Final Clinical Impression(s) / ED Diagnoses Final diagnoses:  Diverticulitis    Rx / DC Orders ED Discharge Orders          Ordered    dicyclomine (BENTYL) 20 MG tablet  2 times daily        08/02/21 1021    ondansetron (ZOFRAN-ODT) 4 MG disintegrating tablet  Every 8 hours PRN        08/02/21 1021    ciprofloxacin (CIPRO) 500 MG tablet  Every 12 hours        08/02/21 1021    metroNIDAZOLE (FLAGYL) 500 MG tablet  3 times daily        08/02/21 1021              Alvira Monday, MD 08/03/21 0012

## 2021-08-02 NOTE — ED Notes (Signed)
Dc instructions reviewed with patient. Patient voiced understanding. Dc with belongings.  °

## 2021-08-02 NOTE — ED Triage Notes (Signed)
Mild left lower mid quadrant pain for about 2 weeks. The pain is now worse and bm's are mucus and has some bright red streaks. Some nausea. Denies fever or vomiitng. The abdominal pain is spasms and water or anything on stomach makes it worse.

## 2021-08-05 ENCOUNTER — Ambulatory Visit
Admission: RE | Admit: 2021-08-05 | Discharge: 2021-08-05 | Disposition: A | Discharge status: Home / Self Care | Payer: Medicare PPO | Source: Ambulatory Visit

## 2021-08-05 ENCOUNTER — Other Ambulatory Visit: Payer: Self-pay

## 2021-08-05 DIAGNOSIS — R928 Other abnormal and inconclusive findings on diagnostic imaging of breast: Secondary | ICD-10-CM

## 2021-08-05 DIAGNOSIS — N6001 Solitary cyst of right breast: Secondary | ICD-10-CM

## 2021-08-05 DIAGNOSIS — N6310 Unspecified lump in the right breast, unspecified quadrant: Secondary | ICD-10-CM

## 2021-08-05 DIAGNOSIS — N6311 Unspecified lump in the right breast, upper outer quadrant: Secondary | ICD-10-CM | POA: Diagnosis not present

## 2021-08-07 ENCOUNTER — Ambulatory Visit
Admission: RE | Admit: 2021-08-07 | Discharge: 2021-08-07 | Disposition: A | Discharge status: Home / Self Care | Payer: Medicare PPO | Source: Ambulatory Visit

## 2021-08-07 ENCOUNTER — Other Ambulatory Visit: Payer: Self-pay

## 2021-08-07 DIAGNOSIS — N6001 Solitary cyst of right breast: Secondary | ICD-10-CM | POA: Diagnosis not present

## 2021-08-07 DIAGNOSIS — N6310 Unspecified lump in the right breast, unspecified quadrant: Secondary | ICD-10-CM

## 2021-08-25 DIAGNOSIS — Z8719 Personal history of other diseases of the digestive system: Secondary | ICD-10-CM | POA: Diagnosis not present

## 2021-08-25 DIAGNOSIS — R300 Dysuria: Secondary | ICD-10-CM | POA: Diagnosis not present

## 2021-10-02 ENCOUNTER — Encounter (HOSPITAL_BASED_OUTPATIENT_CLINIC_OR_DEPARTMENT_OTHER): Payer: Self-pay

## 2021-10-02 ENCOUNTER — Emergency Department (HOSPITAL_BASED_OUTPATIENT_CLINIC_OR_DEPARTMENT_OTHER)
Admission: EM | Admit: 2021-10-02 | Discharge: 2021-10-02 | Disposition: A | Discharge status: Home / Self Care | Payer: Medicare PPO | Attending: Emergency Medicine | Admitting: Emergency Medicine

## 2021-10-02 ENCOUNTER — Other Ambulatory Visit: Payer: Self-pay

## 2021-10-02 ENCOUNTER — Other Ambulatory Visit (HOSPITAL_BASED_OUTPATIENT_CLINIC_OR_DEPARTMENT_OTHER): Payer: Self-pay

## 2021-10-02 ENCOUNTER — Emergency Department (HOSPITAL_BASED_OUTPATIENT_CLINIC_OR_DEPARTMENT_OTHER): Payer: Medicare PPO

## 2021-10-02 DIAGNOSIS — R109 Unspecified abdominal pain: Secondary | ICD-10-CM | POA: Diagnosis present

## 2021-10-02 DIAGNOSIS — R110 Nausea: Secondary | ICD-10-CM | POA: Diagnosis not present

## 2021-10-02 DIAGNOSIS — R1111 Vomiting without nausea: Secondary | ICD-10-CM | POA: Diagnosis not present

## 2021-10-02 DIAGNOSIS — R197 Diarrhea, unspecified: Secondary | ICD-10-CM | POA: Diagnosis not present

## 2021-10-02 DIAGNOSIS — J45909 Unspecified asthma, uncomplicated: Secondary | ICD-10-CM | POA: Diagnosis not present

## 2021-10-02 DIAGNOSIS — R1110 Vomiting, unspecified: Secondary | ICD-10-CM | POA: Diagnosis not present

## 2021-10-02 DIAGNOSIS — K529 Noninfective gastroenteritis and colitis, unspecified: Secondary | ICD-10-CM | POA: Diagnosis not present

## 2021-10-02 LAB — COMPREHENSIVE METABOLIC PANEL
ALT: 26 U/L (ref 0–44)
AST: 39 U/L (ref 15–41)
Albumin: 4.4 g/dL (ref 3.5–5.0)
Alkaline Phosphatase: 67 U/L (ref 38–126)
Anion gap: 15 (ref 5–15)
BUN: 22 mg/dL (ref 8–23)
CO2: 23 mmol/L (ref 22–32)
Calcium: 9.7 mg/dL (ref 8.9–10.3)
Chloride: 102 mmol/L (ref 98–111)
Creatinine, Ser: 0.88 mg/dL (ref 0.44–1.00)
GFR, Estimated: >60 mL/min (ref >60)
Glucose, Bld: 143 mg/dL — ABNORMAL HIGH (ref 70–99)
Potassium: 4 mmol/L (ref 3.5–5.1)
Sodium: 140 mmol/L (ref 135–145)
Total Bilirubin: 0.6 mg/dL (ref 0.3–1.2)
Total Protein: 7.2 g/dL (ref 6.5–8.1)

## 2021-10-02 LAB — CBC WITH DIFFERENTIAL/PLATELET
Abs Immature Granulocytes: 0.05 10*3/uL (ref 0.00–0.07)
Basophils Absolute: 0 10*3/uL (ref 0.0–0.1)
Basophils Relative: 0 %
Eosinophils Absolute: 0.1 10*3/uL (ref 0.0–0.5)
Eosinophils Relative: 1 %
HCT: 45.6 % (ref 36.0–46.0)
Hemoglobin: 15.2 g/dL — ABNORMAL HIGH (ref 12.0–15.0)
Immature Granulocytes: 0 %
Lymphocytes Relative: 5 %
Lymphs Abs: 0.7 10*3/uL (ref 0.7–4.0)
MCH: 29.1 pg (ref 26.0–34.0)
MCHC: 33.3 g/dL (ref 30.0–36.0)
MCV: 87.4 fL (ref 80.0–100.0)
Monocytes Absolute: 0.6 10*3/uL (ref 0.1–1.0)
Monocytes Relative: 4 %
Neutro Abs: 13.5 10*3/uL — ABNORMAL HIGH (ref 1.7–7.7)
Neutrophils Relative %: 90 %
Platelets: 277 10*3/uL (ref 150–400)
RBC: 5.22 MIL/uL — ABNORMAL HIGH (ref 3.87–5.11)
RDW: 14.5 % (ref 11.5–15.5)
WBC: 14.9 10*3/uL — ABNORMAL HIGH (ref 4.0–10.5)
nRBC: 0 % (ref 0.0–0.2)

## 2021-10-02 LAB — LIPASE, BLOOD: Lipase: 61 U/L — ABNORMAL HIGH (ref 11–51)

## 2021-10-02 LAB — C DIFFICILE QUICK SCREEN W PCR REFLEX
C Diff antigen: NEGATIVE
C Diff interpretation: NOT DETECTED
C Diff toxin: NEGATIVE

## 2021-10-02 MED ORDER — LOPERAMIDE HCL 2 MG PO CAPS
2.0000 mg | ORAL_CAPSULE | Freq: Once | ORAL | Status: AC
  Administered 2021-10-02: 2 mg via ORAL
  Filled 2021-10-02: qty 1

## 2021-10-02 MED ORDER — ONDANSETRON HCL 4 MG/2ML IJ SOLN
4.0000 mg | Freq: Once | INTRAMUSCULAR | Status: AC
  Administered 2021-10-02: 4 mg via INTRAVENOUS
  Filled 2021-10-02: qty 2

## 2021-10-02 MED ORDER — ONDANSETRON HCL 4 MG PO TABS
4.0000 mg | ORAL_TABLET | Freq: Four times a day (QID) | ORAL | 0 refills | Status: AC
  Filled 2021-10-02: qty 12, 12d supply, fill #0

## 2021-10-02 MED ORDER — SODIUM CHLORIDE 0.9 % IV BOLUS
500.0000 mL | Freq: Once | INTRAVENOUS | Status: AC
  Administered 2021-10-02: 500 mL via INTRAVENOUS

## 2021-10-02 MED ORDER — LOPERAMIDE HCL 2 MG PO CAPS
2.0000 mg | ORAL_CAPSULE | Freq: Four times a day (QID) | ORAL | 0 refills | Status: AC | PRN
  Filled 2021-10-02: qty 12, 3d supply, fill #0

## 2021-10-02 MED ORDER — IOHEXOL 300 MG/ML  SOLN
100.0000 mL | Freq: Once | INTRAMUSCULAR | Status: AC | PRN
  Administered 2021-10-02: 80 mL via INTRAVENOUS

## 2021-10-02 NOTE — ED Provider Triage Note (Signed)
Emergency Medicine Provider Triage Evaluation Note  Amanda Reid , a 76 y.o. female  was evaluated in triage.  Pt complains of cute onset nausea, vomiting, diarrhea.  Patient arrives by EMS and notes symptoms have been abrupt and severe for the past 2 hours.  She went to bed feeling well.  Notes that her husband has more mild GI symptoms.  Last antibiotics were 1 month ago with diverticulitis flare.  No noticed blood in the stool.  No active abdominal pain.  Denies chest discomfort.  Review of Systems  Positive: Nausea, vomiting, and diarrhea.  Negative: Abdominal pain, CP, SOB  Physical Exam  Ht '4\' 11"'$  (1.499 m)   Wt 70.3 kg   BMI 31.31 kg/m  Gen:   Awake. Appears uncomfortable but able to provide a full history.  Resp:  Normal effort  MSK:   Moves extremities without difficulty  Other:  Abdomen soft and non-tender.   Medical Decision Making  Medically screening exam initiated at 6:49 AM.  Appropriate orders placed.  Amanda Reid was informed that the remainder of the evaluation will be completed by another provider, this initial triage assessment does not replace that evaluation, and the importance of remaining in the ED until their evaluation is complete.  Patient with fairly abrupt onset nausea/vomiting/diarrhea. No active abdominal pain. Suspect viral process with husband having similar symptoms. IVF, meds, and labs ordered.    [name], MD 10/02/21 [phone]

## 2021-10-02 NOTE — ED Triage Notes (Signed)
Arrives EMS from home with c/o "uncontrollable diarrhea".   1 episode of vomiting.   Husband recently had a virus that presented the same.

## 2021-10-02 NOTE — ED Provider Notes (Signed)
[facility] EMERGENCY DEPT Provider Note   CSN: [id number] Arrival date & time: 10/02/21  0636     History  Chief Complaint  Patient presents with   Diarrhea    Amanda Reid is a 76 y.o. female.  With history of high cholesterol and asthma presents with diarrhea and abdominal cramping.  Symptoms started overnight.  Denies any suspicious food intake or fever or chills or sick contacts.  Nothing makes it worse or better.  She has had several loose watery bowels and vomiting throughout the night.  She states some antibiotics but weeks ago.  Denies any fever or chills.  Denies any chest pain or shortness of breath.  The history is provided by the patient.       Home Medications Prior to Admission medications   Medication Sig Start Date End Date Taking? Authorizing Provider  loperamide (IMODIUM) 2 MG capsule Take 1 capsule (2 mg total) by mouth 4 (four) times daily as needed for diarrhea or loose stools. 10/02/21  Yes Giuliana Handyside, DO  ondansetron (ZOFRAN) 4 MG tablet Take 1 tablet (4 mg total) by mouth every 6 (six) hours. 10/02/21  Yes Kinlee Garrison, DO  albuterol (VENTOLIN HFA) 108 (90 Base) MCG/ACT inhaler Inhale 2 puffs into the lungs every 6 (six) hours as needed for wheezing or shortness of breath.    [provider]  atorvastatin (LIPITOR) 40 MG tablet Take 40 mg by mouth daily. 05/30/18   [provider]  Calcium-Magnesium (CAL/MAG CITRATE PO) Take 1 tablet by mouth daily. 07/01/09   [provider]  dicyclomine (BENTYL) 20 MG tablet Take 1 tablet (20 mg total) by mouth 2 (two) times daily. 08/02/21   Gareth Morgan, MD  Magnesium Citrate 125 MG CAPS Take 125 mg by mouth daily.    [provider]  nortriptyline (PAMELOR) 25 MG capsule Take 25 mg by mouth daily. 04/25/18   [provider]  ondansetron (ZOFRAN-ODT) 4 MG disintegrating tablet Take 1 tablet (4 mg total) by mouth every 8 (eight) hours as needed for  nausea or vomiting. 08/02/21   Gareth Morgan, MD      Allergies    Codeine and Sulfa antibiotics    Review of Systems   Review of Systems  Physical Exam Updated Vital Signs BP 113/76   Pulse 93   Temp 98.3 F (36.8 C)   Resp 18   Ht '4\' 11"'$  (1.499 m)   Wt 70.3 kg   SpO2 (!) 80%   BMI 31.31 kg/m  Physical Exam Vitals and nursing note reviewed.  Constitutional:      General: She is not in acute distress.    Appearance: She is well-developed. She is ill-appearing.  HENT:     Head: Normocephalic and atraumatic.     Nose: Nose normal.     Mouth/Throat:     Mouth: Mucous membranes are moist.  Eyes:     Conjunctiva/sclera: Conjunctivae normal.     Pupils: Pupils are equal, round, and reactive to light.  Cardiovascular:     Rate and Rhythm: Normal rate and regular rhythm.     Pulses: Normal pulses.     Heart sounds: No murmur heard. Pulmonary:     Effort: Pulmonary effort is normal. No respiratory distress.     Breath sounds: Normal breath sounds.  Abdominal:     Palpations: Abdomen is soft.     Tenderness: There is abdominal tenderness.  Musculoskeletal:        General: No swelling.  Cervical back: Normal range of motion and neck supple.  Skin:    General: Skin is warm and dry.     Capillary Refill: Capillary refill takes less than 2 seconds.  Neurological:     General: No focal deficit present.     Mental Status: She is alert.  Psychiatric:        Mood and Affect: Mood normal.     ED Results / Procedures / Treatments   Labs (all labs ordered are listed, but only abnormal results are displayed) Labs Reviewed  COMPREHENSIVE METABOLIC PANEL - Abnormal; Notable for the following components:      Result Value   Glucose, Bld 143 (*)    All other components within normal limits  LIPASE, BLOOD - Abnormal; Notable for the following components:   Lipase 61 (*)    All other components within normal limits  CBC WITH DIFFERENTIAL/PLATELET - Abnormal; Notable for  the following components:   WBC 14.9 (*)    RBC 5.22 (*)    Hemoglobin 15.2 (*)    Neutro Abs 13.5 (*)    All other components within normal limits  C DIFFICILE QUICK SCREEN W PCR REFLEX      EKG None  Radiology CT ABDOMEN PELVIS W CONTRAST  Result Date: 10/02/2021 CLINICAL DATA:  Nausea, vomiting common diarrhea EXAM: CT ABDOMEN AND PELVIS WITH CONTRAST TECHNIQUE: Multidetector CT imaging of the abdomen and pelvis was performed using the standard protocol following bolus administration of intravenous contrast. RADIATION DOSE REDUCTION: This exam was performed according to the departmental dose-optimization program which includes automated exposure control, adjustment of the mA and/or kV according to patient size and/or use of iterative reconstruction technique. CONTRAST:  39m OMNIPAQUE IOHEXOL 300 MG/ML  SOLN COMPARISON:  CT abdomen/pelvis 08/02/2021 FINDINGS: Lower chest: The lung bases are clear. The imaged heart is unremarkable. Hepatobiliary: There are scattered tiny hypodense lesions in the liver which are too small to characterize but requiring no specific imaging follow-up, unchanged. The gallbladder is unremarkable. There is no biliary ductal dilatation. Pancreas: Unremarkable. Spleen: Unremarkable. Adrenals/Urinary Tract: The adrenals are unremarkable. Subcentimeter hypodense lesions in the kidneys are too small to characterize but requiring no specific imaging follow-up. There are no suspicious parenchymal lesions. There are no stones in either kidney or along the course of either ureter. There is no hydronephrosis or hydroureter. There is symmetric excretion of contrast into the collecting systems on the delayed images. The bladder is unremarkable. Stomach/Bowel: The stomach is unremarkable. There is diffuse small bowel wall thickening and mucosal hyperemia (for example 2-28). There is no colonic wall thickening; however, the colon is fluid-filled which is in keeping with provided history  of diarrhea. The appendix is normal. There is colonic diverticulosis. There is mild stranding around the distal sigmoid colon in the region of previously seen acute diverticulitis on the study from 08/02/2021 (2-60). There is no evidence of abscess. Vascular/Lymphatic: There is scattered calcified atherosclerotic plaque in the nonaneurysmal abdominal aorta. The major branch vessels are patent. The main portal and splenic veins are patent. There is no abdominopelvic lymphadenopathy. Reproductive: A right-sided uterine fibroid is again noted. There is no adnexal mass. Other: There is no ascites or free air. Musculoskeletal: There is no acute osseous abnormality or suspicious osseous lesion. IMPRESSION: 1. Findings consistent with nonspecific infectious or inflammatory enteritis. 2. Diverticulosis with mild inflammatory stranding around the sigmoid colon at the site of previously seen acute diverticulitis on the study from 08/02/21 may reflect resolving or mild residual diverticulitis.  No evidence of abscess. Electronically Signed   By: Valetta Mole M.D.   On: 10/02/2021 08:20    Procedures Procedures    Medications Ordered in ED Medications  sodium chloride 0.9 % bolus 500 mL (0 mLs Intravenous Stopped 10/02/21 0850)  ondansetron (ZOFRAN) injection 4 mg (4 mg Intravenous Given 10/02/21 0732)  loperamide (IMODIUM) capsule 2 mg (2 mg Oral Given 10/02/21 0814)  iohexol (OMNIPAQUE) 300 MG/ML solution 100 mL (80 mLs Intravenous Contrast Given 10/02/21 0759)  ondansetron (ZOFRAN) injection 4 mg (4 mg Intravenous Given 10/02/21 0853)  sodium chloride 0.9 % bolus 500 mL (500 mLs Intravenous New Bag/Given 10/02/21 8676)    ED Course/ Medical Decision Making/ A&P                           Medical Decision Making Amount and/or Complexity of Data Reviewed Radiology: ordered.  Risk Prescription drug management.   Amanda D Croker is here with nausea, vomiting, diarrhea.  History of high cholesterol.  Normal  vitals.  No fever.  Differential diagnosis is viral process versus foodborne illness versus colitis.  We will get CBC, CMP, lipase, urinalysis.  We will give a dose IV Zofran, IV fluids, Imodium.  Abdominal exam is overall benign but may consider CT scan to further evaluate.  Lower suspicion for C. difficile as antibiotics for a long time ago but will try to send off a C. difficile sample.  Per my review and interpretation of labs no significant anemia or leukocytosis or electrolyte abnormality or kidney injury.  CT scan consistent with infectious or inflammatory enteritis.  Otherwise imaging is unremarkable.  She is feeling better we will give additional dose of IV fluids and IV antiemetics and reevaluate.  Suspect viral/foodborne illness.  Patient feeling better.  Shared decision was made for discharge.  I did offer her observation stay for further symptomatic support but she would try to prefer treatment at home.  I think this is safe.  Discharged with prescription for Zofran and Imodium.  Understands return precautions.  This chart was dictated using voice recognition software.  Despite best efforts to proofread,  errors can occur which can change the documentation meaning.         Final Clinical Impression(s) / ED Diagnoses Final diagnoses:  Noninfectious gastroenteritis, unspecified type    Rx / DC Orders ED Discharge Orders          Ordered    ondansetron (ZOFRAN) 4 MG tablet  Every 6 hours        10/02/21 1000    loperamide (IMODIUM) 2 MG capsule  4 times daily PRN        10/02/21 1000              Lennice Sites, DO 10/02/21 1001

## 2022-02-24 ENCOUNTER — Other Ambulatory Visit: Payer: Self-pay

## 2022-02-24 DIAGNOSIS — Z1231 Encounter for screening mammogram for malignant neoplasm of breast: Secondary | ICD-10-CM

## 2022-03-09 DIAGNOSIS — H5203 Hypermetropia, bilateral: Secondary | ICD-10-CM | POA: Diagnosis not present

## 2022-03-09 DIAGNOSIS — H43813 Vitreous degeneration, bilateral: Secondary | ICD-10-CM | POA: Diagnosis not present

## 2022-03-09 DIAGNOSIS — H18513 Endothelial corneal dystrophy, bilateral: Secondary | ICD-10-CM | POA: Diagnosis not present

## 2022-03-09 DIAGNOSIS — Z961 Presence of intraocular lens: Secondary | ICD-10-CM | POA: Diagnosis not present

## 2022-04-09 ENCOUNTER — Ambulatory Visit
Admission: RE | Admit: 2022-04-09 | Discharge: 2022-04-09 | Disposition: A | Discharge status: Home / Self Care | Payer: Medicare PPO | Source: Ambulatory Visit

## 2022-04-09 DIAGNOSIS — Z1231 Encounter for screening mammogram for malignant neoplasm of breast: Secondary | ICD-10-CM

## 2022-04-22 DIAGNOSIS — M4004 Postural kyphosis, thoracic region: Secondary | ICD-10-CM | POA: Diagnosis not present

## 2022-04-22 DIAGNOSIS — M26601 Right temporomandibular joint disorder, unspecified: Secondary | ICD-10-CM | POA: Diagnosis not present

## 2022-04-22 DIAGNOSIS — M62838 Other muscle spasm: Secondary | ICD-10-CM | POA: Diagnosis not present

## 2022-04-22 DIAGNOSIS — M26602 Left temporomandibular joint disorder, unspecified: Secondary | ICD-10-CM | POA: Diagnosis not present

## 2022-04-22 DIAGNOSIS — M6281 Muscle weakness (generalized): Secondary | ICD-10-CM | POA: Diagnosis not present

## 2022-06-04 DIAGNOSIS — N182 Chronic kidney disease, stage 2 (mild): Secondary | ICD-10-CM | POA: Diagnosis not present

## 2022-06-04 DIAGNOSIS — M542 Cervicalgia: Secondary | ICD-10-CM | POA: Diagnosis not present

## 2022-06-04 DIAGNOSIS — F411 Generalized anxiety disorder: Secondary | ICD-10-CM | POA: Diagnosis not present

## 2022-06-04 DIAGNOSIS — E559 Vitamin D deficiency, unspecified: Secondary | ICD-10-CM | POA: Diagnosis not present

## 2022-06-04 DIAGNOSIS — E785 Hyperlipidemia, unspecified: Secondary | ICD-10-CM | POA: Diagnosis not present

## 2022-06-04 DIAGNOSIS — M6281 Muscle weakness (generalized): Secondary | ICD-10-CM | POA: Diagnosis not present

## 2022-06-04 DIAGNOSIS — D709 Neutropenia, unspecified: Secondary | ICD-10-CM | POA: Diagnosis not present

## 2022-06-04 DIAGNOSIS — M62838 Other muscle spasm: Secondary | ICD-10-CM | POA: Diagnosis not present

## 2022-06-04 DIAGNOSIS — M4004 Postural kyphosis, thoracic region: Secondary | ICD-10-CM | POA: Diagnosis not present

## 2022-06-04 DIAGNOSIS — R7303 Prediabetes: Secondary | ICD-10-CM | POA: Diagnosis not present

## 2022-06-11 DIAGNOSIS — R7303 Prediabetes: Secondary | ICD-10-CM | POA: Diagnosis not present

## 2022-06-11 DIAGNOSIS — E559 Vitamin D deficiency, unspecified: Secondary | ICD-10-CM | POA: Diagnosis not present

## 2022-06-11 DIAGNOSIS — N182 Chronic kidney disease, stage 2 (mild): Secondary | ICD-10-CM | POA: Diagnosis not present

## 2022-06-11 DIAGNOSIS — R6884 Jaw pain: Secondary | ICD-10-CM | POA: Diagnosis not present

## 2022-06-11 DIAGNOSIS — Z0000 Encounter for general adult medical examination without abnormal findings: Secondary | ICD-10-CM | POA: Diagnosis not present

## 2022-06-11 DIAGNOSIS — J45909 Unspecified asthma, uncomplicated: Secondary | ICD-10-CM | POA: Diagnosis not present

## 2022-06-11 DIAGNOSIS — F411 Generalized anxiety disorder: Secondary | ICD-10-CM | POA: Diagnosis not present

## 2022-06-11 DIAGNOSIS — E785 Hyperlipidemia, unspecified: Secondary | ICD-10-CM | POA: Diagnosis not present

## 2022-06-11 DIAGNOSIS — M542 Cervicalgia: Secondary | ICD-10-CM | POA: Diagnosis not present

## 2022-06-11 DIAGNOSIS — J302 Other seasonal allergic rhinitis: Secondary | ICD-10-CM | POA: Diagnosis not present

## 2022-06-17 DIAGNOSIS — M6281 Muscle weakness (generalized): Secondary | ICD-10-CM | POA: Diagnosis not present

## 2022-06-17 DIAGNOSIS — M26602 Left temporomandibular joint disorder, unspecified: Secondary | ICD-10-CM | POA: Diagnosis not present

## 2022-06-17 DIAGNOSIS — M542 Cervicalgia: Secondary | ICD-10-CM | POA: Diagnosis not present

## 2022-06-17 DIAGNOSIS — M4004 Postural kyphosis, thoracic region: Secondary | ICD-10-CM | POA: Diagnosis not present

## 2022-06-17 DIAGNOSIS — M62838 Other muscle spasm: Secondary | ICD-10-CM | POA: Diagnosis not present

## 2022-08-03 DIAGNOSIS — M542 Cervicalgia: Secondary | ICD-10-CM | POA: Diagnosis not present

## 2022-08-03 DIAGNOSIS — M4004 Postural kyphosis, thoracic region: Secondary | ICD-10-CM | POA: Diagnosis not present

## 2022-08-03 DIAGNOSIS — M6281 Muscle weakness (generalized): Secondary | ICD-10-CM | POA: Diagnosis not present

## 2022-08-03 DIAGNOSIS — M26602 Left temporomandibular joint disorder, unspecified: Secondary | ICD-10-CM | POA: Diagnosis not present

## 2022-08-03 DIAGNOSIS — M26601 Right temporomandibular joint disorder, unspecified: Secondary | ICD-10-CM | POA: Diagnosis not present

## 2022-08-03 DIAGNOSIS — M62838 Other muscle spasm: Secondary | ICD-10-CM | POA: Diagnosis not present

## 2022-11-10 DIAGNOSIS — Z23 Encounter for immunization: Secondary | ICD-10-CM | POA: Diagnosis not present

## 2023-03-22 DIAGNOSIS — H43813 Vitreous degeneration, bilateral: Secondary | ICD-10-CM | POA: Diagnosis not present

## 2023-03-22 DIAGNOSIS — Z961 Presence of intraocular lens: Secondary | ICD-10-CM | POA: Diagnosis not present

## 2023-03-22 DIAGNOSIS — H524 Presbyopia: Secondary | ICD-10-CM | POA: Diagnosis not present

## 2023-05-10 ENCOUNTER — Other Ambulatory Visit: Payer: Self-pay

## 2023-05-10 DIAGNOSIS — Z1231 Encounter for screening mammogram for malignant neoplasm of breast: Secondary | ICD-10-CM

## 2023-05-17 ENCOUNTER — Ambulatory Visit
Admission: RE | Admit: 2023-05-17 | Discharge: 2023-05-17 | Disposition: A | Discharge status: Home / Self Care | Source: Ambulatory Visit

## 2023-05-17 DIAGNOSIS — Z1231 Encounter for screening mammogram for malignant neoplasm of breast: Secondary | ICD-10-CM

## 2023-06-07 DIAGNOSIS — E785 Hyperlipidemia, unspecified: Secondary | ICD-10-CM | POA: Diagnosis not present

## 2023-06-07 DIAGNOSIS — F411 Generalized anxiety disorder: Secondary | ICD-10-CM | POA: Diagnosis not present

## 2023-06-07 DIAGNOSIS — R7303 Prediabetes: Secondary | ICD-10-CM | POA: Diagnosis not present

## 2023-06-07 DIAGNOSIS — E559 Vitamin D deficiency, unspecified: Secondary | ICD-10-CM | POA: Diagnosis not present

## 2023-06-20 ENCOUNTER — Other Ambulatory Visit (HOSPITAL_BASED_OUTPATIENT_CLINIC_OR_DEPARTMENT_OTHER): Payer: Self-pay

## 2023-06-20 DIAGNOSIS — E785 Hyperlipidemia, unspecified: Secondary | ICD-10-CM | POA: Diagnosis not present

## 2023-06-20 DIAGNOSIS — Z0000 Encounter for general adult medical examination without abnormal findings: Secondary | ICD-10-CM | POA: Diagnosis not present

## 2023-06-20 DIAGNOSIS — N182 Chronic kidney disease, stage 2 (mild): Secondary | ICD-10-CM | POA: Diagnosis not present

## 2023-06-20 DIAGNOSIS — F411 Generalized anxiety disorder: Secondary | ICD-10-CM | POA: Diagnosis not present

## 2023-06-20 DIAGNOSIS — E559 Vitamin D deficiency, unspecified: Secondary | ICD-10-CM | POA: Diagnosis not present

## 2023-06-20 DIAGNOSIS — R0602 Shortness of breath: Secondary | ICD-10-CM | POA: Diagnosis not present

## 2023-06-20 DIAGNOSIS — J302 Other seasonal allergic rhinitis: Secondary | ICD-10-CM | POA: Diagnosis not present

## 2023-06-20 DIAGNOSIS — J45909 Unspecified asthma, uncomplicated: Secondary | ICD-10-CM | POA: Diagnosis not present

## 2023-06-22 ENCOUNTER — Ambulatory Visit (HOSPITAL_BASED_OUTPATIENT_CLINIC_OR_DEPARTMENT_OTHER)
Admission: RE | Admit: 2023-06-22 | Discharge: 2023-06-22 | Disposition: A | Discharge status: Home / Self Care | Payer: Self-pay | Source: Ambulatory Visit

## 2023-06-22 DIAGNOSIS — E785 Hyperlipidemia, unspecified: Secondary | ICD-10-CM

## 2023-07-15 ENCOUNTER — Other Ambulatory Visit: Payer: Self-pay

## 2023-07-15 DIAGNOSIS — M489 Spondylopathy, unspecified: Secondary | ICD-10-CM

## 2023-07-20 ENCOUNTER — Inpatient Hospital Stay
Admission: RE | Admit: 2023-07-20 | Discharge: 2023-07-20 | Discharge status: Home / Self Care | Source: Ambulatory Visit

## 2023-07-20 DIAGNOSIS — M489 Spondylopathy, unspecified: Secondary | ICD-10-CM

## 2023-07-20 DIAGNOSIS — R222 Localized swelling, mass and lump, trunk: Secondary | ICD-10-CM | POA: Diagnosis not present

## 2023-07-20 MED ORDER — GADOPICLENOL 0.5 MMOL/ML IV SOLN
7.0000 mL | Freq: Once | INTRAVENOUS | Status: AC | PRN
  Administered 2023-07-20: 7 mL via INTRAVENOUS

## 2023-10-04 DIAGNOSIS — Z23 Encounter for immunization: Secondary | ICD-10-CM | POA: Diagnosis not present

## 2024-01-16 ENCOUNTER — Other Ambulatory Visit: Payer: Self-pay

## 2024-01-16 DIAGNOSIS — M489 Spondylopathy, unspecified: Secondary | ICD-10-CM

## 2024-02-03 ENCOUNTER — Other Ambulatory Visit (HOSPITAL_BASED_OUTPATIENT_CLINIC_OR_DEPARTMENT_OTHER): Payer: Self-pay

## 2024-02-03 MED ORDER — ATORVASTATIN CALCIUM 80 MG PO TABS
80.0000 mg | ORAL_TABLET | Freq: Every day | ORAL | 3 refills | Status: AC
  Filled 2024-02-03: qty 90, 90d supply, fill #0

## 2024-02-07 ENCOUNTER — Other Ambulatory Visit (HOSPITAL_BASED_OUTPATIENT_CLINIC_OR_DEPARTMENT_OTHER): Payer: Self-pay

## 2024-02-16 ENCOUNTER — Other Ambulatory Visit

## 2024-03-11 ENCOUNTER — Other Ambulatory Visit
